# Patient Record
Sex: Female | Born: 1975 | Race: Black or African American | Hispanic: No | Marital: Single | Smoking: Current every day smoker
Health system: Southern US, Community
[De-identification: ages and names within clinical notes are randomized; demographics above are authoritative.]

## PROBLEM LIST (undated history)

## (undated) DIAGNOSIS — G4730 Sleep apnea, unspecified: Secondary | ICD-10-CM

## (undated) DIAGNOSIS — K219 Gastro-esophageal reflux disease without esophagitis: Secondary | ICD-10-CM

## (undated) DIAGNOSIS — I10 Essential (primary) hypertension: Secondary | ICD-10-CM

## (undated) DIAGNOSIS — G510 Bell's palsy: Secondary | ICD-10-CM

## (undated) DIAGNOSIS — E785 Hyperlipidemia, unspecified: Secondary | ICD-10-CM

## (undated) DIAGNOSIS — M549 Dorsalgia, unspecified: Secondary | ICD-10-CM

## (undated) HISTORY — PX: TOOTH EXTRACTION: SUR596

## (undated) HISTORY — DX: Gastro-esophageal reflux disease without esophagitis: K21.9

## (undated) HISTORY — PX: COLONOSCOPY: SHX174

## (undated) HISTORY — DX: Bell's palsy: G51.0

## (undated) HISTORY — PX: HERNIA REPAIR: SHX51

## (undated) HISTORY — PX: UTERINE FIBROID SURGERY: SHX826

## (undated) HISTORY — PX: GANGLION CYST EXCISION: SHX1691

## (undated) HISTORY — DX: Hyperlipidemia, unspecified: E78.5

## (undated) HISTORY — DX: Sleep apnea, unspecified: G47.30

---

## 1998-10-03 ENCOUNTER — Other Ambulatory Visit: Admission: RE | Admit: 1998-10-03 | Discharge: 1998-10-03 | Payer: Self-pay | Admitting: Obstetrics

## 2002-02-06 ENCOUNTER — Inpatient Hospital Stay (HOSPITAL_COMMUNITY): Admission: AD | Admit: 2002-02-06 | Discharge: 2002-02-06 | Payer: Self-pay | Admitting: Obstetrics

## 2002-02-07 ENCOUNTER — Encounter: Payer: Self-pay | Admitting: Obstetrics

## 2002-02-17 ENCOUNTER — Ambulatory Visit (HOSPITAL_COMMUNITY): Admission: RE | Admit: 2002-02-17 | Discharge: 2002-02-17 | Payer: Self-pay | Admitting: Obstetrics

## 2002-02-17 ENCOUNTER — Encounter (INDEPENDENT_AMBULATORY_CARE_PROVIDER_SITE_OTHER): Payer: Self-pay

## 2002-08-05 ENCOUNTER — Emergency Department (HOSPITAL_COMMUNITY): Admission: EM | Admit: 2002-08-05 | Discharge: 2002-08-05 | Payer: Self-pay | Admitting: Emergency Medicine

## 2002-08-05 ENCOUNTER — Encounter: Payer: Self-pay | Admitting: Emergency Medicine

## 2003-01-17 ENCOUNTER — Inpatient Hospital Stay (HOSPITAL_COMMUNITY): Admission: AD | Admit: 2003-01-17 | Discharge: 2003-01-17 | Payer: Self-pay | Admitting: Obstetrics

## 2003-06-02 ENCOUNTER — Inpatient Hospital Stay (HOSPITAL_COMMUNITY): Admission: AD | Admit: 2003-06-02 | Discharge: 2003-06-02 | Payer: Self-pay | Admitting: Obstetrics

## 2003-06-05 ENCOUNTER — Encounter (INDEPENDENT_AMBULATORY_CARE_PROVIDER_SITE_OTHER): Payer: Self-pay | Admitting: Specialist

## 2003-06-05 ENCOUNTER — Inpatient Hospital Stay (HOSPITAL_COMMUNITY): Admission: AD | Admit: 2003-06-05 | Discharge: 2003-06-08 | Payer: Self-pay | Admitting: Obstetrics

## 2008-02-18 ENCOUNTER — Ambulatory Visit (HOSPITAL_BASED_OUTPATIENT_CLINIC_OR_DEPARTMENT_OTHER): Admission: RE | Admit: 2008-02-18 | Discharge: 2008-02-18 | Payer: Self-pay | Admitting: General Surgery

## 2010-09-10 NOTE — Op Note (Signed)
Anna Walters, Anna Walters                   ACCOUNT NO.:  [id number]   MEDICAL RECORD NO.:  [id number]          PATIENT TYPE:  AMB   LOCATION:  [facility]                         FACILITY:  [facility]   PHYSICIAN:  Juanetta Gosling, MDDATE OF BIRTH:  [birth year]   DATE OF PROCEDURE:  DATE OF DISCHARGE:                               OPERATIVE REPORT   PREOPERATIVE DIAGNOSIS:  Umbilical hernia.   POSTOPERATIVE DIAGNOSIS:  A less than 1 cm umbilical hernia.   PROCEDURE PERFORMED:  Primary umbilical hernia repair with zero Ethibond  sutures.   SURGEON:  Troy Sine. Dwain Sarna, MD.   ASSISTANT:  None.   ANESTHESIA:  General.   SPECIMENS:  None.   DRAINS:  None.   COMPLICATIONS:  None.   ESTIMATED BLOOD LOSS:  Minimal.   DISPOSITION:  To PACU in stable condition.   INDICATIONS:  [name] is a 35 year old female with a several month  history of a symptomatic umbilical bulge.  She underwent a CT scan that  shows an 8 mm umbilical defect with incarcerated fat and on her exam she  is obese with a diastasis recti, as well as a small umbilical hernia.  Plan for repair of this umbilical hernia.  We did discuss at length that  her diastasis recti would not be repaired.   PROCEDURE:  After informed consent was obtained the patient was taken to  the operating room.  She was administered a gram of intravenous Ancef.  She had sequential compression devices placed.  She underwent general  anesthesia with an LMA.  Her abdomen was then prepped and draped in the  standard sterile surgical fashion.  A surgical time-out was then  performed.  I then made a curvilinear incision in the infraumbilical  region.  Dissection was carried out down to the level of the umbilical  stalk which was encircled with a Kelly clamp.  She is very obese and  this was difficult.  The umbilical stalk was then identified and then it  was removed from the umbilical hernia.  No entrance was made into the  umbilical skin during this  portion of the procedure.  It was everted and  inspected.  The defect was less than one cm just as shown on the CT scan  previously.  I reduced the defect and then after clearing off the edges  with electrocautery I was able to place four, zero Ethibond sutures and  to close the hernia primarily under no tension.  There was no hole upon  completion of this.  This was done with DeBakey in the hole ensuring  that the stitches were going just through the fascia.  Irrigation was  performed.  Hemostasis was observed.  She tolerated this well.  The  umbilicus was then tacked down with 3-0 undyed Vicryl.  The skin was  closed with 4-0  Monocryl.  She had a little bit of epidermal lysis at the skin edges  that I removed just because of the retraction that we were having to do  and through the incision due to her size.  The skin  was then closed with  4-0 Monocryl in subcuticular fashion.  She tolerated this well and was  transferred to the recovery room in stable condition.      Juanetta Gosling, MD  Electronically Signed     MCW/MEDQ  D:  02/18/2008  T:  02/18/2008  Job:  [id number]

## 2010-09-13 NOTE — Op Note (Signed)
   NAME:  Anna Walters, Anna Walters                             ACCOUNT NO.:  [id number]   MEDICAL RECORD NO.:  [id number]                   PATIENT TYPE:   LOCATION:                                       FACILITY:  [facility]   PHYSICIAN:  Kathreen Cosier, M.D.           DATE OF BIRTH:  Aug 27, 1975   DATE OF PROCEDURE:  02/17/2002  DATE OF DISCHARGE:                                 OPERATIVE REPORT   PREOPERATIVE DIAGNOSES:  Blatted ovum.   ANESTHESIA:  General.   PROCEDURE:  The patient placed in lithotomy position.  Perineum and vagina  prepped and draped.  Bladder emptied with straight catheter.  Bimanual  examination revealed uterus to be top normal size.  Endometrial cavity  sounded 9 cm.  Cervix was noted to be open and easily admitted a number 71  Pratt.  A number 10 suction used to aspirate uterine contents.  Small amount  of tissue obtained.  The patient tolerated procedure well.  Taken to  recovery room in good condition.                                               Kathreen Cosier, M.D.    BAM/MEDQ  D:  02/17/2002  T:  02/17/2002  Job:  [id number]

## 2011-01-28 LAB — CBC
HCT: 36.6
MCHC: 34.3
MCV: 106.3 — ABNORMAL HIGH
Platelets: 226
RDW: 13.3

## 2011-01-28 LAB — BASIC METABOLIC PANEL
BUN: 8
CO2: 26
Chloride: 108
Creatinine, Ser: 0.76
Glucose, Bld: 87
Potassium: 4

## 2011-01-28 LAB — DIFFERENTIAL
Basophils Absolute: 0
Basophils Relative: 0
Eosinophils Absolute: 0.1
Eosinophils Relative: 2
Lymphs Abs: 2
Neutrophils Relative %: 56

## 2011-01-28 LAB — PREGNANCY, URINE: Preg Test, Ur: NEGATIVE

## 2014-09-28 ENCOUNTER — Other Ambulatory Visit (HOSPITAL_COMMUNITY): Payer: Self-pay | Admitting: Obstetrics

## 2014-09-28 DIAGNOSIS — N858 Other specified noninflammatory disorders of uterus: Secondary | ICD-10-CM

## 2014-09-28 DIAGNOSIS — Z1231 Encounter for screening mammogram for malignant neoplasm of breast: Secondary | ICD-10-CM

## 2014-10-05 ENCOUNTER — Ambulatory Visit (HOSPITAL_COMMUNITY): Payer: Medicaid Other | Attending: Obstetrics

## 2014-10-05 ENCOUNTER — Ambulatory Visit (HOSPITAL_COMMUNITY): Payer: Medicaid Other

## 2014-12-15 ENCOUNTER — Emergency Department (HOSPITAL_COMMUNITY)
Admission: EM | Admit: 2014-12-15 | Discharge: 2014-12-15 | Disposition: A | Discharge status: Home / Self Care | Payer: No Typology Code available for payment source | Attending: Emergency Medicine | Admitting: Emergency Medicine

## 2014-12-15 ENCOUNTER — Encounter (HOSPITAL_COMMUNITY): Payer: Self-pay | Admitting: Nurse Practitioner

## 2014-12-15 DIAGNOSIS — Y998 Other external cause status: Secondary | ICD-10-CM | POA: Diagnosis not present

## 2014-12-15 DIAGNOSIS — Y9389 Activity, other specified: Secondary | ICD-10-CM | POA: Diagnosis not present

## 2014-12-15 DIAGNOSIS — I10 Essential (primary) hypertension: Secondary | ICD-10-CM | POA: Diagnosis not present

## 2014-12-15 DIAGNOSIS — S299XXA Unspecified injury of thorax, initial encounter: Secondary | ICD-10-CM | POA: Diagnosis present

## 2014-12-15 DIAGNOSIS — S5012XA Contusion of left forearm, initial encounter: Secondary | ICD-10-CM | POA: Insufficient documentation

## 2014-12-15 DIAGNOSIS — Y92410 Unspecified street and highway as the place of occurrence of the external cause: Secondary | ICD-10-CM | POA: Diagnosis not present

## 2014-12-15 DIAGNOSIS — S5011XA Contusion of right forearm, initial encounter: Secondary | ICD-10-CM | POA: Diagnosis not present

## 2014-12-15 DIAGNOSIS — Z720 Tobacco use: Secondary | ICD-10-CM | POA: Diagnosis not present

## 2014-12-15 HISTORY — DX: Essential (primary) hypertension: I10

## 2014-12-15 HISTORY — DX: Dorsalgia, unspecified: M54.9

## 2014-12-15 MED ORDER — METHOCARBAMOL 500 MG PO TABS: 500.0000 mg | ORAL_TABLET | Freq: Two times a day (BID) | ORAL | Status: DC

## 2014-12-15 MED ORDER — NAPROXEN 500 MG PO TABS: 500.0000 mg | ORAL_TABLET | Freq: Two times a day (BID) | ORAL | Status: DC
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
## 2017-10-20 ENCOUNTER — Ambulatory Visit: Payer: Self-pay | Attending: Nurse Practitioner | Admitting: Nurse Practitioner

## 2017-10-20 ENCOUNTER — Encounter: Payer: Self-pay | Admitting: Nurse Practitioner

## 2017-10-20 VITALS — BP 180/109 | HR 64 | Temp 98.3°F | Ht 64.0 in | Wt 178.0 lb

## 2017-10-20 DIAGNOSIS — M546 Pain in thoracic spine: Secondary | ICD-10-CM | POA: Insufficient documentation

## 2017-10-20 DIAGNOSIS — Z8249 Family history of ischemic heart disease and other diseases of the circulatory system: Secondary | ICD-10-CM | POA: Insufficient documentation

## 2017-10-20 DIAGNOSIS — G8929 Other chronic pain: Secondary | ICD-10-CM | POA: Insufficient documentation

## 2017-10-20 DIAGNOSIS — N921 Excessive and frequent menstruation with irregular cycle: Secondary | ICD-10-CM | POA: Insufficient documentation

## 2017-10-20 DIAGNOSIS — I10 Essential (primary) hypertension: Secondary | ICD-10-CM | POA: Insufficient documentation

## 2017-10-20 MED ORDER — AMLODIPINE BESYLATE 5 MG PO TABS: 5.0000 mg | ORAL_TABLET | Freq: Every day | ORAL | 1 refills | Status: DC

## 2017-10-20 MED ORDER — HYDROCHLOROTHIAZIDE 25 MG PO TABS: 25.0000 mg | ORAL_TABLET | Freq: Every day | ORAL | 3 refills | Status: DC

## 2017-10-20 MED ORDER — NAPROXEN 500 MG PO TABS: 500.0000 mg | ORAL_TABLET | Freq: Two times a day (BID) | ORAL | 1 refills | Status: DC

## 2017-10-20 MED ORDER — CLONIDINE HCL 0.1 MG PO TABS
0.2000 mg | ORAL_TABLET | Freq: Once | ORAL | Status: AC
  Administered 2017-10-20: 0.2 mg via ORAL

## 2017-10-20 MED ORDER — METHOCARBAMOL 500 MG PO TABS: 500.0000 mg | ORAL_TABLET | Freq: Two times a day (BID) | ORAL | 1 refills | Status: DC

## 2017-10-20 NOTE — Patient Instructions (Signed)
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
## 2017-10-21 LAB — CMP14+EGFR
A/G RATIO: 1.6 (ref 1.2–2.2)
ALT: 11 IU/L (ref 0–32)
AST: 19 IU/L (ref 0–40)
Albumin: 4.7 g/dL (ref 3.5–5.5)
Alkaline Phosphatase: 68 IU/L (ref 39–117)
BUN/Creatinine Ratio: 12 (ref 9–23)
BUN: 11 mg/dL (ref 6–24)
Bilirubin Total: 0.4 mg/dL (ref 0.0–1.2)
CALCIUM: 9.9 mg/dL (ref 8.7–10.2)
CO2: 24 mmol/L (ref 20–29)
Chloride: 105 mmol/L (ref 96–106)
Creatinine, Ser: 0.95 mg/dL (ref 0.57–1.00)
GFR, EST AFRICAN AMERICAN: 84 mL/min/{1.73_m2} (ref >59)
GFR, EST NON AFRICAN AMERICAN: 73 mL/min/{1.73_m2} (ref >59)
GLOBULIN, TOTAL: 3 g/dL (ref 1.5–4.5)
Glucose: 87 mg/dL (ref 65–99)
POTASSIUM: 4.8 mmol/L (ref 3.5–5.2)
SODIUM: 144 mmol/L (ref 134–144)
TOTAL PROTEIN: 7.7 g/dL (ref 6.0–8.5)

## 2017-10-21 LAB — CBC
Hematocrit: 38.7 % (ref 34.0–46.6)
Hemoglobin: 12.9 g/dL (ref 11.1–15.9)
MCH: 34.6 pg — ABNORMAL HIGH (ref 26.6–33.0)
MCHC: 33.3 g/dL (ref 31.5–35.7)
MCV: 104 fL — AB (ref 79–97)
Platelets: 296 10*3/uL (ref 150–450)
RBC: 3.73 x10E6/uL — AB (ref 3.77–5.28)
RDW: 12.9 % (ref 12.3–15.4)
WBC: 6.5 10*3/uL (ref 3.4–10.8)

## 2017-10-21 LAB — LIPID PANEL
CHOL/HDL RATIO: 2.7 ratio (ref 0.0–4.4)
Cholesterol, Total: 171 mg/dL (ref 100–199)
HDL: 64 mg/dL (ref >39)
LDL Calculated: 95 mg/dL (ref 0–99)
Triglycerides: 62 mg/dL (ref 0–149)
VLDL Cholesterol Cal: 12 mg/dL (ref 5–40)

## 2017-10-21 LAB — TSH: TSH: 0.787 u[IU]/mL (ref 0.450–4.500)

## 2017-11-20 ENCOUNTER — Ambulatory Visit: Payer: Self-pay | Admitting: Nurse Practitioner

## 2018-03-29 ENCOUNTER — Other Ambulatory Visit: Payer: Self-pay | Admitting: Nurse Practitioner

## 2018-03-29 DIAGNOSIS — M546 Pain in thoracic spine: Principal | ICD-10-CM

## 2018-03-29 DIAGNOSIS — G8929 Other chronic pain: Secondary | ICD-10-CM

## 2019-08-30 ENCOUNTER — Encounter (HOSPITAL_COMMUNITY): Payer: Self-pay | Admitting: Emergency Medicine

## 2019-08-30 ENCOUNTER — Emergency Department (HOSPITAL_COMMUNITY)
Admission: EM | Admit: 2019-08-30 | Discharge: 2019-08-30 | Discharge status: Against Medical Advice | Payer: No Typology Code available for payment source | Attending: Emergency Medicine | Admitting: Emergency Medicine

## 2019-08-30 ENCOUNTER — Other Ambulatory Visit: Payer: Self-pay

## 2019-08-30 ENCOUNTER — Other Ambulatory Visit (HOSPITAL_COMMUNITY)
Admission: RE | Admit: 2019-08-30 | Discharge: 2019-08-30 | Disposition: A | Discharge status: Home / Self Care | Payer: No Typology Code available for payment source | Source: Ambulatory Visit | Attending: Nurse Practitioner | Admitting: Nurse Practitioner

## 2019-08-30 ENCOUNTER — Ambulatory Visit (HOSPITAL_BASED_OUTPATIENT_CLINIC_OR_DEPARTMENT_OTHER): Payer: No Typology Code available for payment source | Admitting: Nurse Practitioner

## 2019-08-30 ENCOUNTER — Encounter: Payer: Self-pay | Admitting: Nurse Practitioner

## 2019-08-30 VITALS — BP 212/117 | HR 65 | Temp 97.7°F | Ht 64.0 in | Wt 176.0 lb

## 2019-08-30 DIAGNOSIS — E785 Hyperlipidemia, unspecified: Secondary | ICD-10-CM | POA: Diagnosis not present

## 2019-08-30 DIAGNOSIS — Z131 Encounter for screening for diabetes mellitus: Secondary | ICD-10-CM

## 2019-08-30 DIAGNOSIS — I10 Essential (primary) hypertension: Secondary | ICD-10-CM | POA: Diagnosis not present

## 2019-08-30 DIAGNOSIS — R42 Dizziness and giddiness: Secondary | ICD-10-CM | POA: Insufficient documentation

## 2019-08-30 DIAGNOSIS — Z1231 Encounter for screening mammogram for malignant neoplasm of breast: Secondary | ICD-10-CM

## 2019-08-30 DIAGNOSIS — N921 Excessive and frequent menstruation with irregular cycle: Secondary | ICD-10-CM

## 2019-08-30 DIAGNOSIS — Z114 Encounter for screening for human immunodeficiency virus [HIV]: Secondary | ICD-10-CM | POA: Diagnosis not present

## 2019-08-30 DIAGNOSIS — N898 Other specified noninflammatory disorders of vagina: Secondary | ICD-10-CM

## 2019-08-30 DIAGNOSIS — F17200 Nicotine dependence, unspecified, uncomplicated: Secondary | ICD-10-CM

## 2019-08-30 DIAGNOSIS — G510 Bell's palsy: Secondary | ICD-10-CM | POA: Diagnosis not present

## 2019-08-30 DIAGNOSIS — Z5321 Procedure and treatment not carried out due to patient leaving prior to being seen by health care provider: Secondary | ICD-10-CM | POA: Diagnosis not present

## 2019-08-30 LAB — BASIC METABOLIC PANEL
Anion gap: 11 (ref 5–15)
BUN: 11 mg/dL (ref 6–20)
CO2: 20 mmol/L — ABNORMAL LOW (ref 22–32)
Calcium: 9 mg/dL (ref 8.9–10.3)
Chloride: 106 mmol/L (ref 98–111)
Creatinine, Ser: 0.8 mg/dL (ref 0.44–1.00)
GFR calc Af Amer: >60 mL/min (ref >60)
GFR calc non Af Amer: >60 mL/min (ref >60)
Glucose, Bld: 85 mg/dL (ref 70–99)
Potassium: 4 mmol/L (ref 3.5–5.1)
Sodium: 137 mmol/L (ref 135–145)

## 2019-08-30 LAB — CBC
HCT: 36.7 % (ref 36.0–46.0)
Hemoglobin: 11.9 g/dL — ABNORMAL LOW (ref 12.0–15.0)
MCH: 35.3 pg — ABNORMAL HIGH (ref 26.0–34.0)
MCHC: 32.4 g/dL (ref 30.0–36.0)
MCV: 108.9 fL — ABNORMAL HIGH (ref 80.0–100.0)
Platelets: 271 10*3/uL (ref 150–400)
RBC: 3.37 MIL/uL — ABNORMAL LOW (ref 3.87–5.11)
RDW: 12.3 % (ref 11.5–15.5)
WBC: 6.7 10*3/uL (ref 4.0–10.5)
nRBC: 0 % (ref 0.0–0.2)

## 2019-08-30 LAB — URINALYSIS, ROUTINE W REFLEX MICROSCOPIC
Bacteria, UA: NONE SEEN
Bilirubin Urine: NEGATIVE
Glucose, UA: NEGATIVE mg/dL
Ketones, ur: 5 mg/dL — AB
Leukocytes,Ua: NEGATIVE
Nitrite: NEGATIVE
Protein, ur: NEGATIVE mg/dL
Specific Gravity, Urine: 1.015 (ref 1.005–1.030)
pH: 7 (ref 5.0–8.0)

## 2019-08-30 MED ORDER — SODIUM CHLORIDE 0.9% FLUSH: 3.0000 mL | Freq: Once | INTRAVENOUS | Status: DC

## 2019-08-30 MED ORDER — PREDNISONE 20 MG PO TABS: 60.0000 mg | ORAL_TABLET | Freq: Every day | ORAL | 0 refills | Status: AC

## 2019-08-30 MED ORDER — VALACYCLOVIR HCL 1 G PO TABS: 1000.0000 mg | ORAL_TABLET | Freq: Three times a day (TID) | ORAL | 0 refills | Status: AC

## 2019-08-30 MED ORDER — OMEPRAZOLE 20 MG PO CPDR: 20.0000 mg | DELAYED_RELEASE_CAPSULE | Freq: Every day | ORAL | 0 refills | Status: DC

## 2019-08-30 MED ORDER — CLONIDINE HCL 0.1 MG PO TABS
0.2000 mg | ORAL_TABLET | Freq: Once | ORAL | Status: AC
  Administered 2019-08-30: 0.2 mg via ORAL

## 2019-08-30 NOTE — ED Notes (Signed)
Anna Walters and [name] called for pt x4 no response.

## 2019-08-30 NOTE — ED Triage Notes (Signed)
Pt reports htn for the past year and has been unable to get it under control, not prescribed medications for it. [facility] and sent here for BP of 212/117. Pt has been dizzy since yesterday and states it has gotten worse. Pt reports she was having headaches earlier. Denies pain at this time.
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
## 2019-08-31 LAB — CMP14+EGFR
ALT: 15 IU/L (ref 0–32)
AST: 23 IU/L (ref 0–40)
Albumin/Globulin Ratio: 2 (ref 1.2–2.2)
Albumin: 4.7 g/dL (ref 3.8–4.8)
Alkaline Phosphatase: 72 IU/L (ref 39–117)
BUN/Creatinine Ratio: 17 (ref 9–23)
BUN: 13 mg/dL (ref 6–24)
Bilirubin Total: 0.4 mg/dL (ref 0.0–1.2)
CO2: 23 mmol/L (ref 20–29)
Calcium: 9.6 mg/dL (ref 8.7–10.2)
Chloride: 105 mmol/L (ref 96–106)
Creatinine, Ser: 0.76 mg/dL (ref 0.57–1.00)
GFR calc Af Amer: 109 mL/min/{1.73_m2} (ref >59)
GFR calc non Af Amer: 94 mL/min/{1.73_m2} (ref >59)
Globulin, Total: 2.4 g/dL (ref 1.5–4.5)
Glucose: 78 mg/dL (ref 65–99)
Potassium: 4.6 mmol/L (ref 3.5–5.2)
Sodium: 140 mmol/L (ref 134–144)
Total Protein: 7.1 g/dL (ref 6.0–8.5)

## 2019-08-31 LAB — CBC
Hematocrit: 38.4 % (ref 34.0–46.6)
Hemoglobin: 12.7 g/dL (ref 11.1–15.9)
MCH: 35.1 pg — ABNORMAL HIGH (ref 26.6–33.0)
MCHC: 33.1 g/dL (ref 31.5–35.7)
MCV: 106 fL — ABNORMAL HIGH (ref 79–97)
Platelets: 291 10*3/uL (ref 150–450)
RBC: 3.62 x10E6/uL — ABNORMAL LOW (ref 3.77–5.28)
RDW: 11.7 % (ref 11.7–15.4)
WBC: 7.5 10*3/uL (ref 3.4–10.8)

## 2019-08-31 LAB — LIPID PANEL
Chol/HDL Ratio: 2.9 ratio (ref 0.0–4.4)
Cholesterol, Total: 193 mg/dL (ref 100–199)
HDL: 66 mg/dL (ref >39)
LDL Chol Calc (NIH): 114 mg/dL — ABNORMAL HIGH (ref 0–99)
Triglycerides: 68 mg/dL (ref 0–149)
VLDL Cholesterol Cal: 13 mg/dL (ref 5–40)

## 2019-08-31 LAB — HEMOGLOBIN A1C
Est. average glucose Bld gHb Est-mCnc: 82 mg/dL
Hgb A1c MFr Bld: 4.5 % — ABNORMAL LOW (ref 4.8–5.6)

## 2019-08-31 LAB — HIV ANTIBODY (ROUTINE TESTING W REFLEX): HIV Screen 4th Generation wRfx: NONREACTIVE

## 2019-09-01 ENCOUNTER — Other Ambulatory Visit: Payer: Self-pay | Admitting: Nurse Practitioner

## 2019-09-01 LAB — CERVICOVAGINAL ANCILLARY ONLY
Bacterial Vaginitis (gardnerella): POSITIVE — AB
Candida Glabrata: NEGATIVE
Candida Vaginitis: NEGATIVE
Chlamydia: NEGATIVE
Comment: NEGATIVE
Comment: NEGATIVE
Comment: NEGATIVE
Comment: NEGATIVE
Comment: NEGATIVE
Comment: NORMAL
Neisseria Gonorrhea: NEGATIVE
Trichomonas: NEGATIVE

## 2019-09-01 MED ORDER — ATORVASTATIN CALCIUM 20 MG PO TABS
20.0000 mg | ORAL_TABLET | Freq: Every day | ORAL | 3 refills | Status: DC
Start: 2019-09-01 — End: 2020-09-06

## 2019-09-06 ENCOUNTER — Other Ambulatory Visit: Payer: Self-pay | Admitting: Nurse Practitioner

## 2019-09-06 DIAGNOSIS — G510 Bell's palsy: Secondary | ICD-10-CM

## 2019-09-07 ENCOUNTER — Ambulatory Visit: Payer: No Typology Code available for payment source | Attending: Nurse Practitioner | Admitting: Nurse Practitioner

## 2019-09-07 ENCOUNTER — Other Ambulatory Visit: Payer: Self-pay

## 2019-09-07 ENCOUNTER — Encounter: Payer: Self-pay | Admitting: Nurse Practitioner

## 2019-09-07 DIAGNOSIS — I10 Essential (primary) hypertension: Secondary | ICD-10-CM | POA: Diagnosis not present

## 2019-09-07 DIAGNOSIS — G510 Bell's palsy: Secondary | ICD-10-CM

## 2019-09-07 MED ORDER — HYDROCHLOROTHIAZIDE 25 MG PO TABS: 25.0000 mg | ORAL_TABLET | Freq: Every day | ORAL | 3 refills | Status: DC

## 2019-09-07 MED ORDER — METRONIDAZOLE 500 MG PO TABS: 500.0000 mg | ORAL_TABLET | Freq: Two times a day (BID) | ORAL | 0 refills | Status: AC

## 2019-09-07 MED ORDER — AMLODIPINE BESYLATE 10 MG PO TABS: 10.0000 mg | ORAL_TABLET | Freq: Every day | ORAL | 1 refills | Status: DC

## 2019-09-07 MED ORDER — BLOOD PRESSURE MONITOR DEVI: 0 refills | Status: DC

## 2019-09-07 NOTE — Progress Notes (Signed)
Virtual Visit via Telephone Note Due to national recommendations of social distancing due to Anna Walters, telehealth visit is felt to be most appropriate for this patient at this time.  I discussed the limitations, risks, security and privacy concerns of performing an evaluation and management service by telephone and the availability of in person appointments. I also discussed with the patient that there may be a patient responsible charge related to this service. The patient expressed understanding and agreed to proceed.    I connected with Anna Walters on 09/07/19  at   8:30 AM EDT  EDT by telephone and verified that I am speaking with the correct person using two identifiers.   Consent I discussed the limitations, risks, security and privacy concerns of performing an evaluation and management service by telephone and the availability of in person appointments. I also discussed with the patient that there may be a patient responsible charge related to this service. The patient expressed understanding and agreed to proceed.   Location of Patient: Private Residence   Location of Provider: [place] and [employer] Office    Persons participating in Telemedicine visit: [name] FNP-BC [place]    History of Present Illness: Telemedicine visit for: HTN  She was sent to the Emergency room for hypertensive urgency after an office visit with me on 08-30-2019 (she had been out of blood pressure medications for over a year). At that time she was also experiencing symptoms of re occuring Bell's Palsy. Unfortunately she went to the ED but did not stay for treatment.   Essential Hypertension She has not been able to check her blood pressure as she does not have a device. I have sent one through her insurance today. She states she will have her blood pressure checked at her job today. She will keep me posted through [place] regarding her readings. I have sent in her HCTZ 25  mg and increased her amlodipine 5mg  to 10 mg today. Denies chest pain, shortness of breath, palpitations, lightheadedness, dizziness, headaches or BLE edema.  BP Readings from Last 3 Encounters:  08/30/19 (!) 190/107  08/30/19 (!) 212/117  06/25/Walters (!) 180/109    Bell's Palsy I prescribed her prednisone 20 mg and Valtrex 1000 mg at her last office visit. She did at that time state she had a history of Bell's Palsy in the past but had never seen a Neurologist. I referred her to Neurology and they attempted to contact her however she was not aware they had been trying to contact her. She will give them a call to schedule. Current symptoms of facial droop and right eye tic have not improved much. She has completed all medications.     Past Medical History:  Diagnosis Date  . Back pain   . Bell's palsy   . Hypertension     Past Surgical History:  Procedure Laterality Date  . GANGLION CYST EXCISION    . HERNIA REPAIR    . UTERINE FIBROID SURGERY      Family History  Problem Relation Age of Onset  . Hypertension Mother   . Hypertension Father   . Cancer Maternal Grandmother   . Cancer Paternal Grandmother     Social History   Socioeconomic History  . Marital status: Single    Spouse name: Not on file  . Number of children: Not on file  . Years of education: Not on file  . Highest education level: Not on file  Occupational  History  . Not on file  Tobacco Use  . Smoking status: Current Every Day Smoker    Packs/day: 0.50    Types: Cigarettes  . Smokeless tobacco: Never Used  . Tobacco comment: 8 cigarettes per day  Substance and Sexual Activity  . Alcohol use: Yes    Comment: occasionally   . Drug use: Yes    Types: Marijuana  . Sexual activity: Yes    Birth control/protection: None  Other Topics Concern  . Not on file  Social History Narrative  . Not on file   Social Determinants of Health   Financial Resource Strain:   . Difficulty of Paying Living Expenses:    Food Insecurity:   . Worried About Charity fundraiser in the Last Year:   . Arboriculturist in the Last Year:   Transportation Needs:   . Film/video editor (Medical):   Marland Kitchen Lack of Transportation (Non-Medical):   Physical Activity:   . Days of Exercise per Week:   . Minutes of Exercise per Session:   Stress:   . Feeling of Stress :   Social Connections:   . Frequency of Communication with Friends and Family:   . Frequency of Social Gatherings with Friends and Family:   . Attends Religious Services:   . Active Member of Clubs or Organizations:   . Attends Archivist Meetings:   Marland Kitchen Marital Status:      Observations/Objective: Awake, alert and oriented x 3   Review of Systems  Constitutional: Negative for fever, malaise/fatigue and weight loss.  HENT: Negative.  Negative for nosebleeds.   Eyes: Negative.  Negative for blurred vision, double vision and photophobia.  Respiratory: Negative.  Negative for cough and shortness of breath.   Cardiovascular: Negative.  Negative for chest pain, palpitations and leg swelling.  Gastrointestinal: Negative.  Negative for heartburn, nausea and vomiting.  Musculoskeletal: Negative.  Negative for myalgias.  Neurological: Positive for tingling and sensory change. Negative for dizziness, speech change, focal weakness, seizures and headaches.  Psychiatric/Behavioral: Negative.  Negative for suicidal ideas.    Assessment and Plan: [name] was seen today for follow-up.  Diagnoses and all orders for this visit:  Essential hypertension -     amLODipine (NORVASC) 10 MG tablet; Take 1 tablet (10 mg total) by mouth daily. -     Blood Pressure Monitor DEVI; Please provide patient with insurance approved blood pressure monitor. I10.0 -     hydrochlorothiazide (HYDRODIURIL) 25 MG tablet; Take 1 tablet (25 mg total) by mouth daily.  Facial paralysis/Bells palsy Instructions given to contact [facility] for scheduling.     Follow Up  Instructions Return in about 2 weeks (around 09/21/2019) for BP recheck.     I discussed the assessment and treatment plan with the patient. The patient was provided an opportunity to ask questions and all were answered. The patient agreed with the plan and demonstrated an understanding of the instructions.   The patient was advised to call back or seek an in-person evaluation if the symptoms worsen or if the condition fails to improve as anticipated.  I provided 14 minutes of non-face-to-face time during this encounter including median intraservice time, reviewing previous notes, labs, imaging, medications and explaining diagnosis and management.  Gildardo Pounds, FNP-BC

## 2019-09-23 ENCOUNTER — Ambulatory Visit: Payer: No Typology Code available for payment source | Admitting: Nurse Practitioner

## 2019-10-19 ENCOUNTER — Other Ambulatory Visit: Payer: Self-pay

## 2019-10-19 ENCOUNTER — Ambulatory Visit
Admission: RE | Admit: 2019-10-19 | Discharge: 2019-10-19 | Disposition: A | Discharge status: Home / Self Care | Payer: No Typology Code available for payment source | Source: Ambulatory Visit | Attending: Nurse Practitioner | Admitting: Nurse Practitioner

## 2019-10-19 DIAGNOSIS — Z1231 Encounter for screening mammogram for malignant neoplasm of breast: Secondary | ICD-10-CM

## 2019-10-20 ENCOUNTER — Ambulatory Visit (INDEPENDENT_AMBULATORY_CARE_PROVIDER_SITE_OTHER): Payer: No Typology Code available for payment source | Admitting: Neurology

## 2019-10-20 ENCOUNTER — Encounter: Payer: Self-pay | Admitting: Neurology

## 2019-10-20 ENCOUNTER — Telehealth: Payer: Self-pay | Admitting: Neurology

## 2019-10-20 ENCOUNTER — Other Ambulatory Visit: Payer: Self-pay

## 2019-10-20 VITALS — BP 190/98 | HR 69 | Ht 63.0 in | Wt 180.5 lb

## 2019-10-20 DIAGNOSIS — R519 Headache, unspecified: Secondary | ICD-10-CM

## 2019-10-20 DIAGNOSIS — R29810 Facial weakness: Secondary | ICD-10-CM | POA: Insufficient documentation

## 2019-10-20 MED ORDER — GABAPENTIN 300 MG PO CAPS
300.0000 mg | ORAL_CAPSULE | Freq: Three times a day (TID) | ORAL | 11 refills | Status: DC
Start: 2019-10-20 — End: 2020-09-06

## 2019-10-20 NOTE — Telephone Encounter (Signed)
Medicaid order sent to [facility]. They will obtain the auth and reach out to the patient to schedule.  

## 2019-10-20 NOTE — Progress Notes (Signed)
HISTORICAL  Anna Walters is a 44 year old female, seen in request by her primary care nurse practitioner [name] for evaluation of right facial twitching, and right facial pain, initial evaluation was on October 20, 2019.  I reviewed and summarized the referring note. She had a past medical history of hypertension, taking Norvasc 10 mg daily, hydrochlorothiazide 25 mg daily Hyperlipidemia, Lipitor 20 mg daily  She suffered right Bell's palsy in 2018, presented with right upper and lower face weakness, she only gained partial recovery, still has mild right facial asymmetry, since beginning of 2021, she noticed intermittent right facial muscle twitching, mainly surrounding her eyes, occasionally drop her right cheek muscle  She presented to emergency room on Aug 30, 2019, because of right facial muscle twitching, in addition, she complains of 6 months history of right cheek area radiating pain, started since early 2021, gradually getting worse over the past few months, now she has more frequent radiating pain from right nose towards right ear, sharp radiating pain, lasting for few minutes, followed by a trail of achy discomfort afterwards    Laboratory evaluation showed CBC Hg 11.9,  MCV 108.9, MCH 35.3, BMP, creat 0.8, negative HIV,  LDL 114, A1C 4.5  REVIEW OF SYSTEMS: Full 14 system review of systems performed and notable only for as above All other review of systems were negative.  ALLERGIES: No Known Allergies  HOME MEDICATIONS: Current Outpatient Medications  Medication Sig Dispense Refill  . Acetaminophen (TYLENOL PO) Take by mouth.    Marland Kitchen amLODipine (NORVASC) 10 MG tablet Take 1 tablet (10 mg total) by mouth daily. 90 tablet 1  . atorvastatin (LIPITOR) 20 MG tablet Take 1 tablet (20 mg total) by mouth daily. 90 tablet 3  . Blood Pressure Monitor DEVI Please provide patient with insurance approved blood pressure monitor. I10.0 1 each 0  . hydrochlorothiazide (HYDRODIURIL) 25 MG  tablet Take 1 tablet (25 mg total) by mouth daily. 90 tablet 3  . omeprazole (PRILOSEC) 20 MG capsule Take 1 capsule (20 mg total) by mouth daily for 10 days. 10 capsule 0   No current facility-administered medications for this visit.    PAST MEDICAL HISTORY: Past Medical History:  Diagnosis Date  . Back pain   . Bell's palsy   . Hypertension     PAST SURGICAL HISTORY: Past Surgical History:  Procedure Laterality Date  . GANGLION CYST EXCISION    . HERNIA REPAIR    . TOOTH EXTRACTION    . UTERINE FIBROID SURGERY      FAMILY HISTORY: Family History  Problem Relation Age of Onset  . Hypertension Mother   . Hypertension Father   . Cancer Maternal Grandmother   . Cancer Paternal Grandmother     SOCIAL HISTORY: Social History   Socioeconomic History  . Marital status: Single    Spouse name: Not on file  . Number of children: 3  . Years of education: college  . Highest education level: Associate degree: occupational, Hotel manager, or vocational program  Occupational History  . Occupation: Art therapist  Tobacco Use  . Smoking status: Current Every Day Smoker    Packs/day: 0.25    Types: Cigarettes  . Smokeless tobacco: Never Used  Vaping Use  . Vaping Use: Never used  Substance and Sexual Activity  . Alcohol use: Yes    Comment: socially  . Drug use: Yes    Types: Marijuana    Comment: socially  . Sexual activity: Yes    Birth control/protection:  None  Other Topics Concern  . Not on file  Social History Narrative   Lives at home with her youngest son.   Right-handed.   Caffeine use: 1 cup coffee per day.   Social Determinants of Health   Financial Resource Strain:   . Difficulty of Paying Living Expenses:   Food Insecurity:   . Worried About Charity fundraiser in the Last Year:   . Arboriculturist in the Last Year:   Transportation Needs:   . Film/video editor (Medical):   Marland Kitchen Lack of Transportation (Non-Medical):   Physical Activity:   .  Days of Exercise per Week:   . Minutes of Exercise per Session:   Stress:   . Feeling of Stress :   Social Connections:   . Frequency of Communication with Friends and Family:   . Frequency of Social Gatherings with Friends and Family:   . Attends Religious Services:   . Active Member of Clubs or Organizations:   . Attends Archivist Meetings:   Marland Kitchen Marital Status:   Intimate Partner Violence:   . Fear of Current or Ex-Partner:   . Emotionally Abused:   Marland Kitchen Physically Abused:   . Sexually Abused:      PHYSICAL EXAM   Vitals:   10/20/19 1048  BP: (!) 190/98  Pulse: 69  Weight: 180 lb 8 oz (81.9 kg)  Height: 5\' 3"  (1.6 m)   Not recorded     Body mass index is 31.97 kg/m.  PHYSICAL EXAMNIATION:  Gen: NAD, conversant, well nourised, well groomed                     Cardiovascular: Regular rate rhythm, no peripheral edema, warm, nontender. Eyes: Conjunctivae clear without exudates or hemorrhage Neck: Supple, no carotid bruits. Pulmonary: Clear to auscultation bilaterally   NEUROLOGICAL EXAM:  MENTAL STATUS: Speech:    Speech is normal; fluent and spontaneous with normal comprehension.  Cognition:     Orientation to time, place and person     Normal recent and remote memory     Normal Attention span and concentration     Normal Language, naming, repeating,spontaneous speech     Fund of knowledge   CRANIAL NERVES: CN II: Visual fields are full to confrontation. Pupils are round equal and briskly reactive to light. CN III, IV, VI: extraocular movement are normal. No ptosis. CN V: Facial sensation is intact to light touch bilateral corneal reflexes were normal CN VII: She has mild facial asymmetry, moderate right frontalis, orbicularis oculi weakness, also moderate right cheek muscle weakness, occasionally synergistic movement of right facial muscles twitching of the muscles surrounding her right eye, when she puff her cheeks, she has uncontrollable right  orbicularis oculi muscle twitching  CN VIII: Hearing is normal to causal conversation. CN IX, X: Phonation is normal. CN XI: Head turning and shoulder shrug are intact  MOTOR: There is no pronator drift of out-stretched arms. Muscle bulk and tone are normal. Muscle strength is normal.  REFLEXES: Reflexes are 2+ and symmetric at the biceps, triceps, knees, and ankles. Plantar responses are flexor.  SENSORY: Intact to light touch, pinprick and vibratory sensation are intact in fingers and toes.  COORDINATION: There is no trunk or limb dysmetria noted.  GAIT/STANCE: Posture is normal. Gait is steady with normal steps, base, arm swing, and turning. Heel and toe walking are normal. Tandem gait is normal.  Romberg is absent.   DIAGNOSTIC DATA (LABS,  IMAGING, TESTING) - I reviewed patient records, labs, notes, testing and imaging myself where available.   ASSESSMENT AND PLAN  MICHALINA CALBERT is a 44 y.o. female   History of right Bell's palsy with partial recovery  There was evidence of residual right upper and lower face weakness New onset right facial pain involving right V2 distribution since January 2021  MRI of the brain with without contrast to rule out brain base pathology  Laboratory evaluations  Gabapentin 300 mg 3 times daily  Marcial Pacas, M.D. Ph.D.  [facility] [address] [place], [place] [address] Ph: [phone] Fax: [phone]  CC:  Gildardo Pounds, NP [address] [place],  [place] [address]   Primary Care Physician Gildardo Pounds, NP

## 2019-10-24 ENCOUNTER — Telehealth: Payer: Self-pay | Admitting: Neurology

## 2019-10-24 DIAGNOSIS — E559 Vitamin D deficiency, unspecified: Secondary | ICD-10-CM

## 2019-10-24 MED ORDER — VITAMIN D3 1.25 MG (50000 UT) PO CAPS: 1.0000 | ORAL_CAPSULE | ORAL | 0 refills | Status: AC

## 2019-10-24 NOTE — Telephone Encounter (Signed)
Patient returned your call. She is at work so she said it was okay to leave a message.

## 2019-10-24 NOTE — Telephone Encounter (Signed)
Left message requesting a call back.

## 2019-10-24 NOTE — Telephone Encounter (Signed)
Left patient a detailed message, with results and supplement instructions, on voicemail (ok per DPR).  Provided our number to call back with any questions.

## 2019-10-24 NOTE — Telephone Encounter (Signed)
Please call patient laboratory evaluation showed significantly vitamin D deficiency, level was only 7, with normal should be 30 NG/mL and above, I have called in vitamin D3 supplements 50,000 units once weekly for 12 weeks, then she should followed by over-the-counter vitamin D3 supplements 1000 units daily  Rest of the laboratory evaluation showed no no significant abnormality, with Lyme titer still pending  Meds ordered this encounter  Medications   Cholecalciferol (VITAMIN D3) 1.25 MG (50000 UT) CAPS    Sig: Take 1 capsule by mouth once a week.    Dispense:  12 capsule    Refill:  0    I have also forward the lab results to her primary care Gildardo Pounds, NP

## 2019-10-27 DIAGNOSIS — Z419 Encounter for procedure for purposes other than remedying health state, unspecified: Secondary | ICD-10-CM | POA: Diagnosis not present

## 2019-10-27 LAB — LYME, WESTERN BLOT, SERUM (REFLEXED)
IgG P18 Ab.: ABSENT
IgG P23 Ab.: ABSENT
IgG P28 Ab.: ABSENT
IgG P30 Ab.: ABSENT
IgG P39 Ab.: ABSENT
IgG P45 Ab.: ABSENT
IgG P58 Ab.: ABSENT
IgG P66 Ab.: ABSENT
IgG P93 Ab.: ABSENT
IgM P23 Ab.: ABSENT
IgM P39 Ab.: ABSENT
Lyme IgG Wb: NEGATIVE
Lyme IgM Wb: NEGATIVE

## 2019-10-27 LAB — SEDIMENTATION RATE: Sed Rate: 4 mm/hr (ref 0–32)

## 2019-10-27 LAB — ANGIOTENSIN CONVERTING ENZYME: Angio Convert Enzyme: 56 U/L (ref 14–82)

## 2019-10-27 LAB — IRON AND TIBC
Iron Saturation: 36 % (ref 15–55)
Iron: 110 ug/dL (ref 27–159)
Total Iron Binding Capacity: 306 ug/dL (ref 250–450)
UIBC: 196 ug/dL (ref 131–425)

## 2019-10-27 LAB — B. BURGDORFI ANTIBODIES: Lyme IgG/IgM Ab: 1.44 {ISR} — ABNORMAL HIGH (ref 0.00–0.90)

## 2019-10-27 LAB — ANA W/REFLEX IF POSITIVE: Anti Nuclear Antibody (ANA): NEGATIVE

## 2019-10-27 LAB — VITAMIN B12: Vitamin B-12: 470 pg/mL (ref 232–1245)

## 2019-10-27 LAB — COPPER, SERUM: Copper: 122 ug/dL (ref 80–158)

## 2019-10-27 LAB — CK: Total CK: 163 U/L (ref 32–182)

## 2019-10-27 LAB — FERRITIN: Ferritin: 122 ng/mL (ref 15–150)

## 2019-10-27 LAB — C-REACTIVE PROTEIN: CRP: <1 mg/L (ref 0–10)

## 2019-10-27 LAB — RPR: RPR Ser Ql: NONREACTIVE

## 2019-10-27 LAB — VITAMIN D 25 HYDROXY (VIT D DEFICIENCY, FRACTURES): Vit D, 25-Hydroxy: 7 ng/mL — ABNORMAL LOW (ref 30.0–100.0)

## 2019-11-27 DIAGNOSIS — Z419 Encounter for procedure for purposes other than remedying health state, unspecified: Secondary | ICD-10-CM | POA: Diagnosis not present

## 2019-12-28 DIAGNOSIS — Z419 Encounter for procedure for purposes other than remedying health state, unspecified: Secondary | ICD-10-CM | POA: Diagnosis not present

## 2019-12-29 ENCOUNTER — Encounter: Payer: Self-pay | Admitting: Neurology

## 2019-12-29 ENCOUNTER — Ambulatory Visit: Payer: No Typology Code available for payment source | Admitting: Neurology

## 2019-12-29 NOTE — Progress Notes (Deleted)
PATIENT: Anna Walters DOB: 11/14/1975  REASON FOR VISIT: follow up HISTORY FROM: patient  HISTORY OF PRESENT ILLNESS: Today 12/29/19  HISTORY  Anna Walters is a 44 year old female, seen in request by her primary care nurse practitioner [name] for evaluation of right facial twitching, and right facial pain, initial evaluation was on October 20, 2019.  I reviewed and summarized the referring note. She had a past medical history of hypertension, taking Norvasc 10 mg daily, hydrochlorothiazide 25 mg daily Hyperlipidemia, Lipitor 20 mg daily  She suffered right Bell's palsy in 2018, presented with right upper and lower face weakness, she only gained partial recovery, still has mild right facial asymmetry, since beginning of 2021, she noticed intermittent right facial muscle twitching, mainly surrounding her eyes, occasionally drop her right cheek muscle  She presented to emergency room on Aug 30, 2019, because of right facial muscle twitching, in addition, she complains of 6 months history of right cheek area radiating pain, started since early 2021, gradually getting worse over the past few months, now she has more frequent radiating pain from right nose towards right ear, sharp radiating pain, lasting for few minutes, followed by a trail of achy discomfort afterwards    Laboratory evaluation showed CBC Hg 11.9,  MCV 108.9, MCH 35.3, BMP, creat 0.8, negative HIV,  LDL 114, A1C 4.5  Update December 29, 2019 SS: Laboratory evaluation revealed low vitamin D 7, started on vitamin D supplement 5000 units weekly for 12 weeks, then OTC 1000 units daily.   Extensive laboratory evaluation, RPR, B12, CRP, CK, sed rate, ANA, copper, ferritin, iron, angiotensin converting enzyme were unremarkable.  LYME  REVIEW OF SYSTEMS: Out of a complete 14 system review of symptoms, the patient complains only of the following symptoms, and all other reviewed systems are negative.  ALLERGIES: No Known  Allergies  HOME MEDICATIONS: Outpatient Medications Prior to Visit  Medication Sig Dispense Refill  . Acetaminophen (TYLENOL PO) Take by mouth.    Marland Kitchen amLODipine (NORVASC) 10 MG tablet Take 1 tablet (10 mg total) by mouth daily. 90 tablet 1  . atorvastatin (LIPITOR) 20 MG tablet Take 1 tablet (20 mg total) by mouth daily. 90 tablet 3  . Blood Pressure Monitor DEVI Please provide patient with insurance approved blood pressure monitor. I10.0 1 each 0  . Cholecalciferol (VITAMIN D3) 1.25 MG (50000 UT) CAPS Take 1 capsule by mouth once a week. 12 capsule 0  . gabapentin (NEURONTIN) 300 MG capsule Take 1 capsule (300 mg total) by mouth 3 (three) times daily. 90 capsule 11  . hydrochlorothiazide (HYDRODIURIL) 25 MG tablet Take 1 tablet (25 mg total) by mouth daily. 90 tablet 3  . omeprazole (PRILOSEC) 20 MG capsule Take 1 capsule (20 mg total) by mouth daily for 10 days. 10 capsule 0   No facility-administered medications prior to visit.    PAST MEDICAL HISTORY: Past Medical History:  Diagnosis Date  . Back pain   . Bell's palsy   . Hypertension     PAST SURGICAL HISTORY: Past Surgical History:  Procedure Laterality Date  . GANGLION CYST EXCISION    . HERNIA REPAIR    . TOOTH EXTRACTION    . UTERINE FIBROID SURGERY      FAMILY HISTORY: Family History  Problem Relation Age of Onset  . Hypertension Mother   . Hypertension Father   . Cancer Maternal Grandmother   . Cancer Paternal Grandmother     SOCIAL HISTORY: Social History  Socioeconomic History  . Marital status: Single    Spouse name: Not on file  . Number of children: 3  . Years of education: college  . Highest education level: Associate degree: occupational, Hotel manager, or vocational program  Occupational History  . Occupation: Art therapist  Tobacco Use  . Smoking status: Current Every Day Smoker    Packs/day: 0.25    Types: Cigarettes  . Smokeless tobacco: Never Used  Vaping Use  . Vaping Use: Never used   Substance and Sexual Activity  . Alcohol use: Yes    Comment: socially  . Drug use: Yes    Types: Marijuana    Comment: socially  . Sexual activity: Yes    Birth control/protection: None  Other Topics Concern  . Not on file  Social History Narrative   Lives at home with her youngest son.   Right-handed.   Caffeine use: 1 cup coffee per day.   Social Determinants of Health   Financial Resource Strain:   . Difficulty of Paying Living Expenses: Not on file  Food Insecurity:   . Worried About Charity fundraiser in the Last Year: Not on file  . Ran Out of Food in the Last Year: Not on file  Transportation Needs:   . Lack of Transportation (Medical): Not on file  . Lack of Transportation (Non-Medical): Not on file  Physical Activity:   . Days of Exercise per Week: Not on file  . Minutes of Exercise per Session: Not on file  Stress:   . Feeling of Stress : Not on file  Social Connections:   . Frequency of Communication with Friends and Family: Not on file  . Frequency of Social Gatherings with Friends and Family: Not on file  . Attends Religious Services: Not on file  . Active Member of Clubs or Organizations: Not on file  . Attends Archivist Meetings: Not on file  . Marital Status: Not on file  Intimate Partner Violence:   . Fear of Current or Ex-Partner: Not on file  . Emotionally Abused: Not on file  . Physically Abused: Not on file  . Sexually Abused: Not on file      PHYSICAL EXAM  There were no vitals filed for this visit. There is no height or weight on file to calculate BMI.  Generalized: Well developed, in no acute distress   Neurological examination  Mentation: Alert oriented to time, place, history taking. Follows all commands speech and language fluent Cranial nerve II-XII: Pupils were equal round reactive to light. Extraocular movements were full, visual field were full on confrontational test. Facial sensation and strength were normal. Uvula  tongue midline. Head turning and shoulder shrug  were normal and symmetric. Motor: The motor testing reveals 5 over 5 strength of all 4 extremities. Good symmetric motor tone is noted throughout.  Sensory: Sensory testing is intact to soft touch on all 4 extremities. No evidence of extinction is noted.  Coordination: Cerebellar testing reveals good finger-nose-finger and heel-to-shin bilaterally.  Gait and station: Gait is normal. Tandem gait is normal. Romberg is negative. No drift is seen.  Reflexes: Deep tendon reflexes are symmetric and normal bilaterally.   DIAGNOSTIC DATA (LABS, IMAGING, TESTING) - I reviewed patient records, labs, notes, testing and imaging myself where available.  Lab Results  Component Value Date   WBC 6.7 08/30/2019   HGB 11.9 (L) 08/30/2019   HCT 36.7 08/30/2019   MCV 108.9 (H) 08/30/2019   PLT 271 08/30/2019  Component Value Date/Time   NA 137 08/30/2019 1554   NA 140 08/30/2019 1500   K 4.0 08/30/2019 1554   CL 106 08/30/2019 1554   CO2 20 (L) 08/30/2019 1554   GLUCOSE 85 08/30/2019 1554   GLUCOSE 78 08/30/2019 1500   BUN 11 08/30/2019 1554   BUN 13 08/30/2019 1500   CREATININE 0.80 08/30/2019 1554   CALCIUM 9.0 08/30/2019 1554   PROT 7.1 08/30/2019 1500   ALBUMIN 4.7 08/30/2019 1500   AST 23 08/30/2019 1500   ALT 15 08/30/2019 1500   ALKPHOS 72 08/30/2019 1500   BILITOT 0.4 08/30/2019 1500   GFRNONAA >60 08/30/2019 1554   GFRAA >60 08/30/2019 1554   Lab Results  Component Value Date   CHOL 193 08/30/2019   HDL 66 08/30/2019   LDLCALC 114 (H) 08/30/2019   TRIG 68 08/30/2019   CHOLHDL 2.9 08/30/2019   Lab Results  Component Value Date   HGBA1C 4.5 (L) 08/30/2019   Lab Results  Component Value Date   VITAMINB12 470 10/20/2019   Lab Results  Component Value Date   TSH 0.787 10/20/2017      ASSESSMENT AND PLAN 44 y.o. year old female  has a past medical history of Back pain, Bell's palsy, and Hypertension. here with  ***   I spent 15 minutes with the patient. 50% of this time was spent   Butler Denmark, [place], DNP 12/29/2019, 5:41 AM [facility] [address], [place], [place] [address] [phone]

## 2020-01-27 DIAGNOSIS — Z419 Encounter for procedure for purposes other than remedying health state, unspecified: Secondary | ICD-10-CM | POA: Diagnosis not present

## 2020-02-27 DIAGNOSIS — Z419 Encounter for procedure for purposes other than remedying health state, unspecified: Secondary | ICD-10-CM | POA: Diagnosis not present

## 2020-03-28 DIAGNOSIS — Z419 Encounter for procedure for purposes other than remedying health state, unspecified: Secondary | ICD-10-CM | POA: Diagnosis not present

## 2020-04-28 DIAGNOSIS — Z419 Encounter for procedure for purposes other than remedying health state, unspecified: Secondary | ICD-10-CM | POA: Diagnosis not present

## 2020-05-29 DIAGNOSIS — Z419 Encounter for procedure for purposes other than remedying health state, unspecified: Secondary | ICD-10-CM | POA: Diagnosis not present

## 2020-06-26 DIAGNOSIS — Z419 Encounter for procedure for purposes other than remedying health state, unspecified: Secondary | ICD-10-CM | POA: Diagnosis not present

## 2020-07-27 DIAGNOSIS — Z419 Encounter for procedure for purposes other than remedying health state, unspecified: Secondary | ICD-10-CM | POA: Diagnosis not present

## 2020-08-14 ENCOUNTER — Ambulatory Visit: Payer: Self-pay | Admitting: *Deleted

## 2020-08-14 NOTE — Telephone Encounter (Signed)
Pt called in c/o her BP being elevated and her finger tips on both hands her thumbs, index fingers and middle fingers being blue and numb. She checked her BP at [facility] and at home with her cuff and was getting very elevated readings.  The systolic being very high.   "I don't remember what my bottom numbers were but they were high but the top numbers were real high".    "My fingers get like this when my BP is high.   She c/o her vision being very blurry and having occasional headaches.  She has been off of her BP medications for 4-5 months due to losing her job and not having insurance or a way to pay for her medication.    Due to her symptoms and per the protocol I advised her to go to the ED.  I was explaining why and she hung up.    I sent my notes to [facility] and wellness for [name], NP information.    Reason for Disposition . [9] Systolic BP  >= 563 OR Diastolic >= 875 AND [6] cardiac or neurologic symptoms (e.g., chest pain, difficulty breathing, unsteady gait, blurred vision)    Finger tips on both hands blue and numb from BP being elevated.   Been without BP medications for 4-5 months  Answer Assessment - Initial Assessment Questions 1. BLOOD PRESSURE: "What is the blood pressure?" "Did you take at least two measurements 5 minutes apart?"     BP is high.   I go to [employer].   200/   At home 198 2. ONSET: "When did you take your blood pressure?"  My index finger, thumb and middle fingers on both hands are blue.  My BP makes it happen.   I've been out of BP medication for 4-5 months due to I lost I job.    I'm having blurry vision.   I just close my eyes are refocus.     [facility] 200/119 yesterday.    My home cuff is 198/79 I think.   3. HOW: "How did you obtain the blood pressure?" (e.g., visiting nurse, automatic home BP monitor)     [facility] and my home cuff 4. HISTORY: "Do you have a history of high blood pressure?"     Yes 5. MEDICATIONS: "Are  you taking any medications for blood pressure?" "Have you missed any doses recently?"     No been out of meds for 4-5 months due to I lost my job. 6. OTHER SYMPTOMS: "Do you have any symptoms?" (e.g., headache, chest pain, blurred vision, difficulty breathing, weakness)     Occasional headache, blurred vision, my fingers are blue, numb and tingling for a week now.   7. PREGNANCY: "Is there any chance you are pregnant?" "When was your last menstrual period?"     Not asked  Protocols used: BLOOD PRESSURE - HIGH-A-AH

## 2020-08-15 NOTE — Telephone Encounter (Signed)
Will forward to provider  

## 2020-08-16 NOTE — Telephone Encounter (Signed)
This is emergent and should be evaluated as such at urgent care or emergency room if her fingers are turning blue.

## 2020-08-16 NOTE — Telephone Encounter (Signed)
I also have not seen her in over a year. She needs to make an appointment. It looks like she sees angela in June

## 2020-08-17 NOTE — Telephone Encounter (Signed)
Returned pt call and went over provider response. Pt states her fingers are still blue. Pt states went she gets off work she will go to the urgent care. Pt doesn't have any questions or concerns

## 2020-08-26 DIAGNOSIS — Z419 Encounter for procedure for purposes other than remedying health state, unspecified: Secondary | ICD-10-CM | POA: Diagnosis not present

## 2020-09-06 ENCOUNTER — Encounter: Payer: Self-pay | Admitting: Physician Assistant

## 2020-09-06 ENCOUNTER — Other Ambulatory Visit: Payer: Self-pay

## 2020-09-06 ENCOUNTER — Ambulatory Visit: Payer: No Typology Code available for payment source | Attending: Physician Assistant | Admitting: Physician Assistant

## 2020-09-06 VITALS — BP 149/86 | HR 70 | Resp 18 | Ht 66.0 in | Wt 171.4 lb

## 2020-09-06 DIAGNOSIS — H539 Unspecified visual disturbance: Secondary | ICD-10-CM | POA: Diagnosis not present

## 2020-09-06 DIAGNOSIS — N951 Menopausal and female climacteric states: Secondary | ICD-10-CM | POA: Diagnosis not present

## 2020-09-06 DIAGNOSIS — E559 Vitamin D deficiency, unspecified: Secondary | ICD-10-CM | POA: Diagnosis not present

## 2020-09-06 DIAGNOSIS — R232 Flushing: Secondary | ICD-10-CM | POA: Diagnosis not present

## 2020-09-06 DIAGNOSIS — E785 Hyperlipidemia, unspecified: Secondary | ICD-10-CM | POA: Diagnosis not present

## 2020-09-06 DIAGNOSIS — G629 Polyneuropathy, unspecified: Secondary | ICD-10-CM

## 2020-09-06 DIAGNOSIS — I10 Essential (primary) hypertension: Secondary | ICD-10-CM | POA: Diagnosis not present

## 2020-09-06 LAB — GLUCOSE, POCT (MANUAL RESULT ENTRY): POC Glucose: 93 mg/dl (ref 70–99)

## 2020-09-06 MED ORDER — VENLAFAXINE HCL ER 37.5 MG PO CP24
37.5000 mg | ORAL_CAPSULE | Freq: Every day | ORAL | 2 refills | Status: DC
  Filled 2020-09-06: qty 30, 30d supply, fill #0
  Filled 2020-10-25: qty 30, 30d supply, fill #1

## 2020-09-06 MED ORDER — AMLODIPINE BESYLATE 10 MG PO TABS
10.0000 mg | ORAL_TABLET | Freq: Every day | ORAL | 1 refills | Status: DC
  Filled 2020-09-06: qty 90, 90d supply, fill #0

## 2020-09-06 MED ORDER — AMLODIPINE BESYLATE 10 MG PO TABS: 10.0000 mg | ORAL_TABLET | Freq: Every day | ORAL | 1 refills | Status: DC

## 2020-09-06 MED ORDER — CLONIDINE HCL 0.1 MG PO TABS
0.1000 mg | ORAL_TABLET | Freq: Once | ORAL | Status: AC
  Administered 2020-09-06: 0.1 mg via ORAL

## 2020-09-06 MED ORDER — HYDROCHLOROTHIAZIDE 25 MG PO TABS
25.0000 mg | ORAL_TABLET | Freq: Every day | ORAL | 3 refills | Status: DC
Start: 2020-09-06 — End: 2020-12-07
  Filled 2020-09-06: qty 90, 90d supply, fill #0

## 2020-09-06 MED ORDER — ATORVASTATIN CALCIUM 20 MG PO TABS
20.0000 mg | ORAL_TABLET | Freq: Every day | ORAL | 3 refills | Status: DC
  Filled 2020-09-06: qty 90, 90d supply, fill #0

## 2020-09-06 MED ORDER — GABAPENTIN 300 MG PO CAPS
300.0000 mg | ORAL_CAPSULE | Freq: Three times a day (TID) | ORAL | 11 refills | Status: DC
  Filled 2020-09-06: qty 90, 30d supply, fill #0

## 2020-09-06 MED ORDER — HYDROCHLOROTHIAZIDE 25 MG PO TABS: 25.0000 mg | ORAL_TABLET | Freq: Every day | ORAL | 3 refills | Status: DC

## 2020-09-06 MED ORDER — ATORVASTATIN CALCIUM 20 MG PO TABS: 20.0000 mg | ORAL_TABLET | Freq: Every day | ORAL | 3 refills | Status: DC

## 2020-09-06 MED ORDER — GABAPENTIN 300 MG PO CAPS: 300.0000 mg | ORAL_CAPSULE | Freq: Three times a day (TID) | ORAL | 11 refills | Status: DC

## 2020-09-06 NOTE — Progress Notes (Signed)
Anna Walters, is a 45 y.o. female  UXL:[id number]  ZDG:[id number]  DOB - 12-08-1975  Subjective:  Chief Complaint and HPI: Anna Walters is a 45 y.o. female here today to get back on track with her health.  She has been out of all of her meds for at least 5-6 months.  She is s/p uterine ablation about 2 years ago.  She started having hot flashes about 7-8 months ago, trouble sleeping, and some mood swings and depression.  Intermittent dizziness for several months when she goes from sitting to standing.    She also c/o blurry vision on and off for months.  No CP/no SOB.     ROS:   Constitutional:  No f/c, No night sweats, No unexplained weight loss. EENT:  + vision changes, + blurry vision, No hearing changes. No mouth, throat, or ear problems.  Respiratory: No cough, No SOB Cardiac: No CP, no palpitations GI:  No abd pain, No N/V/D. GU: No Urinary s/sx Musculoskeletal: No joint pain Neuro: No headache, intermittent dizziness, no motor weakness.  Skin: No rash Endocrine:  No polydipsia. No polyuria.  Psych: Denies SI/HI  No problems updated.  ALLERGIES: No Known Allergies  PAST MEDICAL HISTORY: Past Medical History:  Diagnosis Date  . Back pain   . Bell's palsy   . Hypertension     MEDICATIONS AT HOME: Prior to Admission medications   Medication Sig Start Date End Date Taking? Authorizing Provider  Acetaminophen (TYLENOL PO) Take by mouth.   Yes [provider]  Blood Pressure Monitor DEVI Please provide patient with insurance approved blood pressure monitor. I10.0 09/07/19  Yes Gildardo Pounds, NP  Cholecalciferol (VITAMIN D3) 1.25 MG (50000 UT) CAPS Take 1 capsule by mouth once a week. 10/24/19  Yes Marcial Pacas, MD  amLODipine (NORVASC) 10 MG tablet Take 1 tablet (10 mg total) by mouth daily. 09/06/20 12/05/20  Argentina Donovan, PA-C  atorvastatin (LIPITOR) 20 MG tablet Take 1 tablet (20 mg total) by mouth daily. 09/06/20   Argentina Donovan, PA-C  gabapentin  (NEURONTIN) 300 MG capsule Take 1 capsule (300 mg total) by mouth 3 (three) times daily. 09/06/20   Argentina Donovan, PA-C  hydrochlorothiazide (HYDRODIURIL) 25 MG tablet Take 1 tablet (25 mg total) by mouth daily. 09/06/20   Argentina Donovan, PA-C  omeprazole (PRILOSEC) 20 MG capsule Take 1 capsule (20 mg total) by mouth daily for 10 days. 08/30/19 09/09/19  Gildardo Pounds, NP     Objective:  EXAM:   Vitals:   09/06/20 1357  BP: (!) 157/119  Pulse: 70  Resp: 18  SpO2: 100%  Weight: 171 lb 6.4 oz (77.7 kg)  Height: 5\' 6"  (1.676 m)   Recheck BP after clonidine 149/86, pulse 72 General appearance : A&OX3. NAD. Non-toxic-appearing HEENT: Atraumatic and Normocephalic.  PERRLA. EOM intact. Neck: supple, no JVD. No cervical lymphadenopathy. No thyromegaly Chest/Lungs:  Breathing-non-labored, Good air entry bilaterally, breath sounds normal without rales, rhonchi, or wheezing  CVS: S1 S2 regular, no murmurs, gallops, rubs  Extremities: Bilateral Lower Ext shows no edema, both legs are warm to touch with = pulse throughout Neurology:  CN II-XII grossly intact, Non focal.   Psych:  TP linear. J/I WNL. Normal speech. Appropriate eye contact and affect.  Skin:  No Rash  Data Review Lab Results  Component Value Date   HGBA1C 4.5 (L) 08/30/2019     Assessment & Plan   1. Essential hypertension uncontrolled-out of meds X5-6 months.  Resume meds and check BP daily and record and have available for your next visit We have discussed target BP range and blood pressure goal. I have advised patient to check BP regularly and to call us back or report to clinic if the numbers are consistently higher than 140/90. We discussed the importance of compliance with medical therapy and DASH diet recommended, consequences of uncontrolled hypertension discussed.  - Comprehensive metabolic panel - CBC with Differential/Platelet - cloNIDine (CATAPRES) tablet 0.1 mg - amLODipine (NORVASC) 10 MG tablet; Take 1  tablet (10 mg total) by mouth daily.  Dispense: 90 tablet; Refill: 1 - hydrochlorothiazide (HYDRODIURIL) 25 MG tablet; Take 1 tablet (25 mg total) by mouth daily.  Dispense: 90 tablet; Refill: 3 - Ambulatory referral to Ophthalmology - Hemoglobin A1c  2. Hot flashes Consider clonidine if BP is still high at follow up to help with BP and hot flashes.   - TSH - FSH/LH -start low dose effexor 37.5 and titrate as needed  3. Menopausal symptom effexor - TSH - FSH/LH  4. Vitamin D deficiency - Vitamin D, 25-hydroxy  5. Dyslipidemia, goal LDL below 70 - Lipid panel - atorvastatin (LIPITOR) 20 MG tablet; Take 1 tablet (20 mg total) by mouth daily.  Dispense: 90 tablet; Refill: 3  6. Neuropathy - gabapentin (NEURONTIN) 300 MG capsule; Take 1 capsule (300 mg total) by mouth 3 (three) times daily.  Dispense: 90 capsule; Refill: 11 - Hemoglobin A1c  7. Vision changes Not acute/no red flags - Glucose (CBG) - Ambulatory referral to Ophthalmology - Hemoglobin A1c   Patient have been counseled extensively about nutrition and exercise  Return for 3-4 weeks with Lurena Joiner;  3 months with PCP.  The patient was given clear instructions to go to ER or return to medical center if symptoms don't improve, worsen or new problems develop. The patient verbalized understanding. The patient was told to call to get lab results if they haven't heard anything in the next week.     Freeman Caldron, PA-C [facility] and [address] [place], [place]   09/06/2020, 2:11 PMPatient ID: [name], female   DOB: [birth year], 45 y.o.   MRN: [id number]

## 2020-09-06 NOTE — Patient Instructions (Addendum)
Check blood pressure daily and record and have available for next visit for review  Drink 80-100 ounces water daily   Hypertension, Adult Hypertension is another name for high blood pressure. High blood pressure forces your heart to work harder to pump blood. This can cause problems over time. There are two numbers in a blood pressure reading. There is a top number (systolic) over a bottom number (diastolic). It is best to have a blood pressure that is below 120/80. Healthy choices can help lower your blood pressure, or you may need medicine to help lower it. What are the causes? The cause of this condition is not known. Some conditions may be related to high blood pressure. What increases the risk?  Smoking.  Having type 2 diabetes mellitus, high cholesterol, or both.  Not getting enough exercise or physical activity.  Being overweight.  Having too much fat, sugar, calories, or salt (sodium) in your diet.  Drinking too much alcohol.  Having long-term (chronic) kidney disease.  Having a family history of high blood pressure.  Age. Risk increases with age.  Race. You may be at higher risk if you are African American.  Gender. Men are at higher risk than women before age 51. After age 54, women are at higher risk than men.  Having obstructive sleep apnea.  Stress. What are the signs or symptoms?  High blood pressure may not cause symptoms. Very high blood pressure (hypertensive crisis) may cause: ? Headache. ? Feelings of worry or nervousness (anxiety). ? Shortness of breath. ? Nosebleed. ? A feeling of being sick to your stomach (nausea). ? Throwing up (vomiting). ? Changes in how you see. ? Very bad chest pain. ? Seizures. How is this treated?  This condition is treated by making healthy lifestyle changes, such as: ? Eating healthy foods. ? Exercising more. ? Drinking less alcohol.  Your health care provider may prescribe medicine if lifestyle changes are not  enough to get your blood pressure under control, and if: ? Your top number is above 130. ? Your bottom number is above 80.  Your personal target blood pressure may vary. Follow these instructions at home: Eating and drinking  If told, follow the DASH eating plan. To follow this plan: ? Fill one half of your plate at each meal with fruits and vegetables. ? Fill one fourth of your plate at each meal with whole grains. Whole grains include whole-wheat pasta, brown rice, and whole-grain bread. ? Eat or drink low-fat dairy products, such as skim milk or low-fat yogurt. ? Fill one fourth of your plate at each meal with low-fat (lean) proteins. Low-fat proteins include fish, chicken without skin, eggs, beans, and tofu. ? Avoid fatty meat, cured and processed meat, or chicken with skin. ? Avoid pre-made or processed food.  Eat less than 1,500 mg of salt each day.  Do not drink alcohol if: ? Your doctor tells you not to drink. ? You are pregnant, may be pregnant, or are planning to become pregnant.  If you drink alcohol: ? Limit how much you use to:  0-1 drink a day for women.  0-2 drinks a day for men. ? Be aware of how much alcohol is in your drink. In the [place] one drink equals one 12 oz bottle of beer (355 mL), one 5 oz glass of wine (148 mL), or one 1 oz glass of hard liquor (44 mL).   Lifestyle  Work with your doctor to stay at a healthy weight or  to lose weight. Ask your doctor what the best weight is for you.  Get at least 30 minutes of exercise most days of the week. This may include walking, swimming, or biking.  Get at least 30 minutes of exercise that strengthens your muscles (resistance exercise) at least 3 days a week. This may include lifting weights or doing Pilates.  Do not use any products that contain nicotine or tobacco, such as cigarettes, e-cigarettes, and chewing tobacco. If you need help quitting, ask your doctor.  Check your blood pressure at home as told by  your doctor.  Keep all follow-up visits as told by your doctor. This is important.   Medicines  Take over-the-counter and prescription medicines only as told by your doctor. Follow directions carefully.  Do not skip doses of blood pressure medicine. The medicine does not work as well if you skip doses. Skipping doses also puts you at risk for problems.  Ask your doctor about side effects or reactions to medicines that you should watch for. Contact a doctor if you:  Think you are having a reaction to the medicine you are taking.  Have headaches that keep coming back (recurring).  Feel dizzy.  Have swelling in your ankles.  Have trouble with your vision. Get help right away if you:  Get a very bad headache.  Start to feel mixed up (confused).  Feel weak or numb.  Feel faint.  Have very bad pain in your: ? Chest. ? Belly (abdomen).  Throw up more than once.  Have trouble breathing. Summary  Hypertension is another name for high blood pressure.  High blood pressure forces your heart to work harder to pump blood.  For most people, a normal blood pressure is less than 120/80.  Making healthy choices can help lower blood pressure. If your blood pressure does not get lower with healthy choices, you may need to take medicine. This information is not intended to replace advice given to you by your health care provider. Make sure you discuss any questions you have with your health care provider. Document Revised: 12/23/2017 Document Reviewed: 12/23/2017 Elsevier Patient Education  2021 [employer].

## 2020-09-07 LAB — LIPID PANEL
Chol/HDL Ratio: 3.2 ratio (ref 0.0–4.4)
Cholesterol, Total: 196 mg/dL (ref 100–199)
HDL: 61 mg/dL (ref >39)
LDL Chol Calc (NIH): 121 mg/dL — ABNORMAL HIGH (ref 0–99)
Triglycerides: 77 mg/dL (ref 0–149)
VLDL Cholesterol Cal: 14 mg/dL (ref 5–40)

## 2020-09-07 LAB — COMPREHENSIVE METABOLIC PANEL
ALT: 13 IU/L (ref 0–32)
AST: 13 IU/L (ref 0–40)
Albumin/Globulin Ratio: 1.7 (ref 1.2–2.2)
Albumin: 4.7 g/dL (ref 3.8–4.8)
Alkaline Phosphatase: 71 IU/L (ref 44–121)
BUN/Creatinine Ratio: 12 (ref 9–23)
BUN: 11 mg/dL (ref 6–24)
Bilirubin Total: 0.5 mg/dL (ref 0.0–1.2)
CO2: 23 mmol/L (ref 20–29)
Calcium: 9.6 mg/dL (ref 8.7–10.2)
Chloride: 104 mmol/L (ref 96–106)
Creatinine, Ser: 0.94 mg/dL (ref 0.57–1.00)
Globulin, Total: 2.7 g/dL (ref 1.5–4.5)
Glucose: 84 mg/dL (ref 65–99)
Potassium: 4.5 mmol/L (ref 3.5–5.2)
Sodium: 141 mmol/L (ref 134–144)
Total Protein: 7.4 g/dL (ref 6.0–8.5)
eGFR: 75 mL/min/{1.73_m2} (ref >59)

## 2020-09-07 LAB — FSH/LH
FSH: 53.2 m[IU]/mL
LH: 31.9 m[IU]/mL

## 2020-09-07 LAB — CBC WITH DIFFERENTIAL/PLATELET
Basophils Absolute: 0 10*3/uL (ref 0.0–0.2)
Basos: 1 %
EOS (ABSOLUTE): 0.1 10*3/uL (ref 0.0–0.4)
Eos: 2 %
Hematocrit: 39.2 % (ref 34.0–46.6)
Hemoglobin: 13.1 g/dL (ref 11.1–15.9)
Immature Grans (Abs): 0 10*3/uL (ref 0.0–0.1)
Immature Granulocytes: 0 %
Lymphocytes Absolute: 2.5 10*3/uL (ref 0.7–3.1)
Lymphs: 39 %
MCH: 34.7 pg — ABNORMAL HIGH (ref 26.6–33.0)
MCHC: 33.4 g/dL (ref 31.5–35.7)
MCV: 104 fL — ABNORMAL HIGH (ref 79–97)
Monocytes Absolute: 0.4 10*3/uL (ref 0.1–0.9)
Monocytes: 6 %
Neutrophils Absolute: 3.4 10*3/uL (ref 1.4–7.0)
Neutrophils: 52 %
Platelets: 311 10*3/uL (ref 150–450)
RBC: 3.78 x10E6/uL (ref 3.77–5.28)
RDW: 11.6 % — ABNORMAL LOW (ref 11.7–15.4)
WBC: 6.4 10*3/uL (ref 3.4–10.8)

## 2020-09-07 LAB — HEMOGLOBIN A1C
Est. average glucose Bld gHb Est-mCnc: 94 mg/dL
Hgb A1c MFr Bld: 4.9 % (ref 4.8–5.6)

## 2020-09-07 LAB — TSH: TSH: 0.555 u[IU]/mL (ref 0.450–4.500)

## 2020-09-07 LAB — VITAMIN D 25 HYDROXY (VIT D DEFICIENCY, FRACTURES): Vit D, 25-Hydroxy: 31.6 ng/mL (ref 30.0–100.0)

## 2020-09-13 ENCOUNTER — Telehealth (INDEPENDENT_AMBULATORY_CARE_PROVIDER_SITE_OTHER): Payer: Self-pay

## 2020-09-13 NOTE — Telephone Encounter (Signed)
-----   Message from Argentina Donovan, [place] sent at 09/11/2020  3:40 PM EDT ----- Eat heart healthy diet as LDL cholesterol is elevated.  Your labs do show menopause and is what is causing the hot flashes.  Take blood pressures  and meds as we discussed and follow up as planned.  You may need to see your gynecologist to review hot flash options if the effexor does not help with menopausal symptoms.  Your thyroid is normal.  Follow up as planned.  Thanks, Freeman Caldron, PA-C

## 2020-09-13 NOTE — Telephone Encounter (Signed)
Spoke with patient. After verifying date of birth she is aware of all labs results. Nat Christen, CMA

## 2020-09-26 ENCOUNTER — Ambulatory Visit: Payer: No Typology Code available for payment source | Admitting: Physician Assistant

## 2020-09-26 DIAGNOSIS — Z419 Encounter for procedure for purposes other than remedying health state, unspecified: Secondary | ICD-10-CM | POA: Diagnosis not present

## 2020-09-27 ENCOUNTER — Other Ambulatory Visit: Payer: Self-pay

## 2020-09-27 ENCOUNTER — Ambulatory Visit: Payer: No Typology Code available for payment source | Attending: Nurse Practitioner | Admitting: Pharmacist

## 2020-09-27 ENCOUNTER — Encounter: Payer: Self-pay | Admitting: Pharmacist

## 2020-09-27 VITALS — BP 138/82

## 2020-09-27 DIAGNOSIS — I10 Essential (primary) hypertension: Secondary | ICD-10-CM

## 2020-09-27 NOTE — Progress Notes (Signed)
   S:    PCP: Zelda   Patient arrives in good spirits. Presents to the clinic for hypertension evaluation, counseling, and management. Patient was referred on 09/06/2020 by Levada Dy. BP at that visit was 149/86, however, pt reported that she was out of her medications 5-6 months prior to that visit. Levada Dy restarted pt's BP medications.   Medication adherence reported, however, she has not taken her medications todaty. Pt denies chest pain, dyspnea, HA, blurred vision.   Current BP Medications include:  amlodipine 10 mg daily, HCTZ 25 mg daily  Dietary habits include: tries to limit sodium but admits to snacking on chips; drinks coffee daily  Exercise habits include: none  Family / Social history:  FHx: HTN Tobacco: current 0.25 PPD smoker Alcohol: denies current use    O:  Vitals:   09/27/20 1615  BP: 138/82   Home BP readings: none  Last 3 Office BP readings: BP Readings from Last 3 Encounters:  09/27/20 138/82  09/06/20 (!) 149/86  10/20/19 (!) 190/98    BMET    Component Value Date/Time   NA 141 09/06/2020 1432   K 4.5 09/06/2020 1432   CL 104 09/06/2020 1432   CO2 23 09/06/2020 1432   GLUCOSE 84 09/06/2020 1432   GLUCOSE 85 08/30/2019 1554   BUN 11 09/06/2020 1432   CREATININE 0.94 09/06/2020 1432   CALCIUM 9.6 09/06/2020 1432   GFRNONAA >60 08/30/2019 1554   GFRAA >60 08/30/2019 1554    Renal function: CrCl cannot be calculated (Patient's most recent lab result is older than the maximum 21 days allowed.).  Clinical ASCVD: No  The 10-year ASCVD risk score Mikey Bussing DC Jr., et al., 2013) is: 6.2%   Values used to calculate the score:     Age: 45 years     Sex: Female     Is Non-Hispanic African American: Yes     Diabetic: No     Tobacco smoker: Yes     Systolic Blood Pressure: 854 mmHg     Is BP treated: Yes     HDL Cholesterol: 61 mg/dL     Total Cholesterol: 196 mg/dL   A/P: Hypertension longstanding currently close to goal on current medications. BP  Goal = < 130/80 mmHg. Medication adherence reported, however, she has not taken her medications today. No med changes today.  -Continued current regimen.  -Counseled on lifestyle modifications for blood pressure control including reduced dietary sodium, increased exercise, adequate sleep.  Results reviewed and written information provided.   Total time in face-to-face counseling 15 minutes.  F/U Clinic Visit in 3 weeks.  Benard Halsted, PharmD, [facility] [place], [place] [phone]

## 2020-10-18 ENCOUNTER — Ambulatory Visit: Payer: No Typology Code available for payment source | Admitting: Pharmacist

## 2020-10-25 ENCOUNTER — Other Ambulatory Visit: Payer: Self-pay

## 2020-10-26 DIAGNOSIS — Z419 Encounter for procedure for purposes other than remedying health state, unspecified: Secondary | ICD-10-CM | POA: Diagnosis not present

## 2020-10-30 ENCOUNTER — Other Ambulatory Visit: Payer: Self-pay

## 2020-11-02 ENCOUNTER — Ambulatory Visit: Payer: Self-pay | Admitting: *Deleted

## 2020-11-02 ENCOUNTER — Telehealth: Payer: Self-pay | Admitting: Nurse Practitioner

## 2020-11-02 NOTE — Telephone Encounter (Signed)
Reason for Disposition  Abdominal pain is a chronic symptom (recurrent or ongoing AND present > 4 weeks)    Known 5 uterine fibroids   Abd cramping left lower abd radiating into left thigh.  Answer Assessment - Initial Assessment Questions 1. LOCATION: "Where does it hurt?"      I'm having uterine pain in the center and to the left side of my abd. 2. RADIATION: "Does the pain shoot anywhere else?" (e.g., chest, back)     Radiating into my left leg thigh area.   It would do that when I was having my periods.   I'm no longer having periods for 2 yrs. 3. ONSET: "When did the pain begin?" (e.g., minutes, hours or days ago)      Tylenol is not helping.   2 days this pain has been going on.   I have uterine fibroids 5 of them. 4. SUDDEN: "Gradual or sudden onset?"     Gradually started and getting worse.    5. PATTERN "Does the pain come and go, or is it constant?"    - If constant: "Is it getting better, staying the same, or worsening?"      (Note: Constant means the pain never goes away completely; most serious pain is constant and it progresses)     - If intermittent: "How long does it last?" "Do you have pain now?"     (Note: Intermittent means the pain goes away completely between bouts)     Intermittent like when I had my periods. 6. SEVERITY: "How bad is the pain?"  (e.g., Scale 1-10; mild, moderate, or severe)   - MILD (1-3): doesn't interfere with normal activities, abdomen soft and not tender to touch    - MODERATE (4-7): interferes with normal activities or awakens from sleep, abdomen tender to touch    - SEVERE (8-10): excruciating pain, doubled over, unable to do any normal activities      7 all day   10 at the worst 7. RECURRENT SYMPTOM: "Have you ever had this type of stomach pain before?" If Yes, ask: "When was the last time?" and "What happened that time?"      Yes uterine fibroids 8. CAUSE: "What do you think is causing the stomach pain?"     Fibroids.   9.  RELIEVING/AGGRAVATING FACTORS: "What makes it better or worse?" (e.g., movement, antacids, bowel movement)     Sometimes the pain going into my left thigh makes it hard to walk. 10. OTHER SYMPTOMS: "Do you have any other symptoms?" (e.g., back pain, diarrhea, fever, urination pain, vomiting)       No.  11. PREGNANCY: "Is there any chance you are pregnant?" "When was your last menstrual period?"       N/A  Protocols used: Abdominal Pain - [facility]

## 2020-11-02 NOTE — Telephone Encounter (Signed)
Noted  

## 2020-11-02 NOTE — Telephone Encounter (Signed)
Pt calling in c/o having abd pain in center lower abd and to the left side radiating down into her left thigh.   She has 5 uterine fibroids and has had this pain before.  Over the last 2 days the pain has been worse even with Tylenol.   No vaginal bleeding.  Not had a period for 2 yrs.  The pain started gradually 2 days ago and intermittently has gotten worse.   "It's cramping like when I used to have my cycles"s. Denies any other symptoms like diarrhea or nausea.  There are no appts available with [facility] within the protocol timeframe to be seen of 2 wks.   She has an appt with [name], NP on December 07, 2020 at 4:10.   No appts between now and then.   Pt agreeable to waiting until that appt since she knows it's the fibroids hurting.    I instructed her to go to the urgent care or ED if the pain becomes more severe or the Tylenol is not helping at all.   She was agreeable to this plan.

## 2020-11-08 ENCOUNTER — Telehealth: Payer: Self-pay | Admitting: Nurse Practitioner

## 2020-11-08 NOTE — Telephone Encounter (Signed)
Copied from [place] [id number]. Topic: General - Inquiry >> Nov 05, 2020  9:54 AM Greggory Keen D wrote: Reason for CRM: Pt called asking if she could get a referral to an obgyn.  She called and  Talked to a triage nurse last week about her symptoms and hs an appt in aug.  /she is just wanting to know if she can get  referral to gyn  CB#  [phone] >> Nov 05, 2020 12:52 PM Oneta Rack wrote: Patient would like referral for OBGYN expedited.

## 2020-11-09 NOTE — Telephone Encounter (Signed)
Pt was called and a VM was left informing patient to return phone call to get more information on the need for the referral.

## 2020-11-26 DIAGNOSIS — Z419 Encounter for procedure for purposes other than remedying health state, unspecified: Secondary | ICD-10-CM | POA: Diagnosis not present

## 2020-12-07 ENCOUNTER — Encounter: Payer: Self-pay | Admitting: Nurse Practitioner

## 2020-12-07 ENCOUNTER — Other Ambulatory Visit: Payer: Self-pay

## 2020-12-07 ENCOUNTER — Ambulatory Visit: Payer: No Typology Code available for payment source | Attending: Nurse Practitioner | Admitting: Nurse Practitioner

## 2020-12-07 VITALS — BP 162/97 | HR 65 | Temp 98.5°F | Ht 64.0 in | Wt 169.4 lb

## 2020-12-07 DIAGNOSIS — Z1231 Encounter for screening mammogram for malignant neoplasm of breast: Secondary | ICD-10-CM

## 2020-12-07 DIAGNOSIS — N941 Unspecified dyspareunia: Secondary | ICD-10-CM

## 2020-12-07 DIAGNOSIS — I10 Essential (primary) hypertension: Secondary | ICD-10-CM

## 2020-12-07 DIAGNOSIS — E785 Hyperlipidemia, unspecified: Secondary | ICD-10-CM | POA: Diagnosis not present

## 2020-12-07 DIAGNOSIS — K219 Gastro-esophageal reflux disease without esophagitis: Secondary | ICD-10-CM | POA: Diagnosis not present

## 2020-12-07 DIAGNOSIS — G629 Polyneuropathy, unspecified: Secondary | ICD-10-CM | POA: Diagnosis not present

## 2020-12-07 DIAGNOSIS — F32A Depression, unspecified: Secondary | ICD-10-CM | POA: Diagnosis not present

## 2020-12-07 DIAGNOSIS — F419 Anxiety disorder, unspecified: Secondary | ICD-10-CM

## 2020-12-07 DIAGNOSIS — Z1211 Encounter for screening for malignant neoplasm of colon: Secondary | ICD-10-CM

## 2020-12-07 MED ORDER — ATORVASTATIN CALCIUM 20 MG PO TABS
20.0000 mg | ORAL_TABLET | Freq: Every day | ORAL | 3 refills | Status: AC
  Filled 2020-12-07: qty 30, 30d supply, fill #0

## 2020-12-07 MED ORDER — VENLAFAXINE HCL ER 37.5 MG PO CP24
37.5000 mg | ORAL_CAPSULE | Freq: Every day | ORAL | 2 refills | Status: AC
  Filled 2020-12-07: qty 30, 30d supply, fill #0
  Filled 2021-02-25: qty 30, 30d supply, fill #1
  Filled 2021-06-18: qty 30, 30d supply, fill #0
  Filled 2021-06-18: qty 30, 30d supply, fill #2

## 2020-12-07 MED ORDER — HYDROCHLOROTHIAZIDE 25 MG PO TABS: 25.0000 mg | ORAL_TABLET | Freq: Every day | ORAL | 3 refills | Status: DC

## 2020-12-07 MED ORDER — VENLAFAXINE HCL ER 37.5 MG PO CP24: 37.5000 mg | ORAL_CAPSULE | Freq: Every day | ORAL | 2 refills | Status: DC

## 2020-12-07 MED ORDER — ATORVASTATIN CALCIUM 20 MG PO TABS: 20.0000 mg | ORAL_TABLET | Freq: Every day | ORAL | 3 refills | Status: DC

## 2020-12-07 MED ORDER — LISINOPRIL-HYDROCHLOROTHIAZIDE 20-25 MG PO TABS
1.0000 | ORAL_TABLET | Freq: Every day | ORAL | 3 refills | Status: AC
  Filled 2020-12-07 – 2021-06-18 (×2): qty 30, 30d supply, fill #0
  Filled 2021-06-18: qty 30, 30d supply, fill #1
  Filled 2021-07-09: qty 30, 30d supply, fill #0

## 2020-12-07 MED ORDER — GABAPENTIN 300 MG PO CAPS
300.0000 mg | ORAL_CAPSULE | Freq: Three times a day (TID) | ORAL | 11 refills | Status: AC
  Filled 2020-12-07: qty 90, 30d supply, fill #0

## 2020-12-07 MED ORDER — AMLODIPINE BESYLATE 10 MG PO TABS: 10.0000 mg | ORAL_TABLET | Freq: Every day | ORAL | 1 refills | Status: DC

## 2020-12-07 MED ORDER — OMEPRAZOLE 20 MG PO CPDR: 20.0000 mg | DELAYED_RELEASE_CAPSULE | Freq: Every day | ORAL | 1 refills | Status: DC

## 2020-12-07 MED ORDER — HYDROCHLOROTHIAZIDE 25 MG PO TABS
25.0000 mg | ORAL_TABLET | Freq: Every day | ORAL | 3 refills | Status: DC
  Filled 2020-12-07: qty 90, 90d supply, fill #0

## 2020-12-07 MED ORDER — OMEPRAZOLE 20 MG PO CPDR
20.0000 mg | DELAYED_RELEASE_CAPSULE | Freq: Every day | ORAL | 1 refills | Status: AC
  Filled 2020-12-07 – 2021-06-18 (×2): qty 30, 30d supply, fill #0
  Filled 2021-06-18: qty 30, 30d supply, fill #1

## 2020-12-07 MED ORDER — AMLODIPINE BESYLATE 10 MG PO TABS
10.0000 mg | ORAL_TABLET | Freq: Every day | ORAL | 1 refills | Status: AC
  Filled 2020-12-07 – 2021-07-09 (×2): qty 30, 30d supply, fill #0

## 2020-12-07 MED ORDER — GABAPENTIN 300 MG PO CAPS: 300.0000 mg | ORAL_CAPSULE | Freq: Three times a day (TID) | ORAL | 11 refills | Status: DC

## 2020-12-07 NOTE — Progress Notes (Signed)
Assessment & Plan:  Maleeha was seen today for hypertension.  Diagnoses and all orders for this visit:  Essential hypertension -     amLODipine (NORVASC) 10 MG tablet; Take 1 tablet (10 mg total) by mouth daily. -     lisinopril-hydrochlorothiazide (ZESTORETIC) 20-25 MG tablet; Take 1 tablet by mouth daily. Continue all antihypertensives as prescribed.  Remember to bring in your blood pressure log with you for your follow up appointment.  DASH/Mediterranean Diets are healthier choices for HTN.    Anxiety and depression -     venlafaxine XR (EFFEXOR XR) 37.5 MG 24 hr capsule; Take 1 capsule (37.5 mg total) by mouth daily with breakfast.  Dyspareunia in female -     Ambulatory referral to Gynecology  Gastroesophageal reflux disease without esophagitis -     omeprazole (PRILOSEC) 20 MG capsule; Take 1 capsule (20 mg total) by mouth daily. INSTRUCTIONS: Avoid GERD Triggers: acidic, spicy or fried foods, caffeine, coffee, sodas,  alcohol and chocolate.    Neuropathy -     gabapentin (NEURONTIN) 300 MG capsule; Take 1 capsule (300 mg total) by mouth 3 (three) times daily.  Dyslipidemia, goal LDL below 70 -     atorvastatin (LIPITOR) 20 MG tablet; Take 1 tablet (20 mg total) by mouth daily. INSTRUCTIONS: Work on a low fat, heart healthy diet and participate in regular aerobic exercise program by working out at least 150 minutes per week; 5 days a week-30 minutes per day. Avoid red meat/beef/steak,  fried foods. junk foods, sodas, sugary drinks, unhealthy snacking, alcohol and smoking.  Drink at least 80 oz of water per day and monitor your carbohydrate intake daily.    Breast cancer screening by mammogram -     MM DIGITAL SCREENING BILATERAL; Future  Colon cancer screening -     Ambulatory referral to Gastroenterology  Patient has been counseled on age-appropriate routine health concerns for screening and prevention. These are reviewed and up-to-date. Referrals have been placed  accordingly. Immunizations are up-to-date or declined.    Subjective:   Chief Complaint  Patient presents with   Hypertension   Hypertension Pertinent negatives include no blurred vision, chest pain, headaches, malaise/fatigue, palpitations or shortness of breath.  Anna Walters 45 y.o. female presents to office today for follow up to HTN. She has a past medical history of Back pain, Bell's palsy, and Hypertension, anxiety and depression.   HTN Blood pressure poorly controlled. Will add lisinopril 20 mg to HCTZ 25 mg today. She will continue on amlodipine 10 mg. Denies chest pain, shortness of breath, palpitations, lightheadedness, dizziness, headaches or BLE edema.  She is trying to quit smoking.  BP Readings from Last 3 Encounters:  12/07/20 (!) 162/97  09/27/20 138/82  09/06/20 (!) 149/86    Anxiety and Depression  Taking effexor XR 37.5 mg daily as prescribed. PHQ9 scores have improved as well as GAD7.  Depression screen [facility] 2/9 12/07/2020 09/06/2020 08/30/2019 10/20/2017  Decreased Interest 0 3 1 0  Down, Depressed, Hopeless 1 3 0 1  PHQ - 2 Score '1 6 1 1  '$ Altered sleeping - '3 1 3  '$ Tired, decreased energy - '3 2 3  '$ Change in appetite - '2 1 3  '$ Feeling bad or failure about yourself  - 1 0 1  Trouble concentrating - 1 0 0  Moving slowly or fidgety/restless - 0 1 0  Suicidal thoughts - 0 0 0  PHQ-9 Score - '16 6 11    '$ GAD 7 :  Generalized Anxiety Score 12/07/2020 09/06/2020 08/30/2019 10/20/2017  Nervous, Anxious, on Edge 1 2 0 3  Control/stop worrying '2 2 2 3  '$ Worry too much - different things '2 2 2 3  '$ Trouble relaxing '1 2 2 3  '$ Restless 0 2 1 0  Easily annoyed or irritable '2 2 3 3  '$ Afraid - awful might happen 0 2 0 3  Total GAD 7 Score '8 14 10 18  '$ Anxiety Difficulty Somewhat difficult - - -     Review of Systems  Constitutional:  Negative for fever, malaise/fatigue and weight loss.  HENT: Negative.  Negative for nosebleeds.   Eyes: Negative.  Negative for blurred vision, double  vision and photophobia.  Respiratory: Negative.  Negative for cough and shortness of breath.   Cardiovascular: Negative.  Negative for chest pain, palpitations and leg swelling.  Gastrointestinal:  Positive for heartburn. Negative for abdominal pain, blood in stool, constipation, diarrhea, melena, nausea and vomiting.  Genitourinary:        Moderage vaginal atrophy with dyspareunia   Musculoskeletal: Negative.  Negative for myalgias.  Neurological:  Positive for sensory change. Negative for dizziness, focal weakness, seizures and headaches.  Psychiatric/Behavioral: Negative.  Negative for suicidal ideas.    Past Medical History:  Diagnosis Date   Back pain    Bell's palsy    Hypertension     Past Surgical History:  Procedure Laterality Date   GANGLION CYST EXCISION     HERNIA REPAIR     TOOTH EXTRACTION     UTERINE FIBROID SURGERY      Family History  Problem Relation Age of Onset   Hypertension Mother    Hypertension Father    Cancer Maternal Grandmother    Cancer Paternal Grandmother     Social History Reviewed with no changes to be made today.   Outpatient Medications Prior to Visit  Medication Sig Dispense Refill   Acetaminophen (TYLENOL PO) Take by mouth.     Blood Pressure Monitor DEVI Please provide patient with insurance approved blood pressure monitor. I10.0 1 each 0   Cholecalciferol (VITAMIN D3) 1.25 MG (50000 UT) CAPS Take 1 capsule by mouth once a week. 12 capsule 0   atorvastatin (LIPITOR) 20 MG tablet Take 1 tablet (20 mg total) by mouth daily. 90 tablet 3   gabapentin (NEURONTIN) 300 MG capsule Take 1 capsule (300 mg total) by mouth 3 (three) times daily. 90 capsule 11   hydrochlorothiazide (HYDRODIURIL) 25 MG tablet Take 1 tablet (25 mg total) by mouth daily. 90 tablet 3   venlafaxine XR (EFFEXOR XR) 37.5 MG 24 hr capsule Take 1 capsule (37.5 mg total) by mouth daily with breakfast. 30 capsule 2   amLODipine (NORVASC) 10 MG tablet Take 1 tablet (10 mg  total) by mouth daily. 90 tablet 1   omeprazole (PRILOSEC) 20 MG capsule Take 1 capsule (20 mg total) by mouth daily for 10 days. 10 capsule 0   No facility-administered medications prior to visit.    No Known Allergies     Objective:    BP (!) 162/97 (BP Location: Left Arm, Patient Position: Sitting, Cuff Size: Large)   Pulse 65   Temp 98.5 F (36.9 C) (Oral)   Ht '5\' 4"'$  (1.626 m)   Wt 169 lb 6.4 oz (76.8 kg)   SpO2 95%   BMI 29.08 kg/m  Wt Readings from Last 3 Encounters:  12/07/20 169 lb 6.4 oz (76.8 kg)  09/06/20 171 lb 6.4 oz (77.7 kg)  10/20/19  180 lb 8 oz (81.9 kg)    Physical Exam Vitals and nursing note reviewed.  Constitutional:      Appearance: She is well-developed.  HENT:     Head: Normocephalic and atraumatic.  Cardiovascular:     Rate and Rhythm: Normal rate and regular rhythm.     Heart sounds: Normal heart sounds. No murmur heard.   No friction rub. No gallop.  Pulmonary:     Effort: Pulmonary effort is normal. No tachypnea or respiratory distress.     Breath sounds: Normal breath sounds. No decreased breath sounds, wheezing, rhonchi or rales.  Chest:     Chest wall: No tenderness.  Abdominal:     General: Bowel sounds are normal.     Palpations: Abdomen is soft.  Musculoskeletal:        General: Normal range of motion.     Cervical back: Normal range of motion.  Skin:    General: Skin is warm and dry.  Neurological:     Mental Status: She is alert and oriented to person, place, and time.     Coordination: Coordination normal.  Psychiatric:        Behavior: Behavior normal. Behavior is cooperative.        Thought Content: Thought content normal.        Judgment: Judgment normal.         Patient has been counseled extensively about nutrition and exercise as well as the importance of adherence with medications and regular follow-up. The patient was given clear instructions to go to ER or return to medical center if symptoms don't improve,  worsen or new problems develop. The patient verbalized understanding.   Follow-up: Return for 3 week f/u with [facility] for BP check. See me in October for PAP.Marland Kitchen   Gildardo Pounds, FNP-BC [facility] [place], [place]   12/07/2020, 4:52 PM

## 2020-12-27 DIAGNOSIS — Z419 Encounter for procedure for purposes other than remedying health state, unspecified: Secondary | ICD-10-CM | POA: Diagnosis not present

## 2021-01-01 ENCOUNTER — Ambulatory Visit: Payer: No Typology Code available for payment source | Admitting: Pharmacist

## 2021-01-26 DIAGNOSIS — Z419 Encounter for procedure for purposes other than remedying health state, unspecified: Secondary | ICD-10-CM | POA: Diagnosis not present

## 2021-01-31 DIAGNOSIS — H5203 Hypermetropia, bilateral: Secondary | ICD-10-CM | POA: Diagnosis not present

## 2021-01-31 DIAGNOSIS — G510 Bell's palsy: Secondary | ICD-10-CM | POA: Diagnosis not present

## 2021-01-31 DIAGNOSIS — H04123 Dry eye syndrome of bilateral lacrimal glands: Secondary | ICD-10-CM | POA: Diagnosis not present

## 2021-02-04 ENCOUNTER — Ambulatory Visit: Payer: No Typology Code available for payment source | Admitting: Nurse Practitioner

## 2021-02-18 ENCOUNTER — Ambulatory Visit: Payer: Medicaid Other

## 2021-02-25 ENCOUNTER — Other Ambulatory Visit: Payer: Self-pay

## 2021-02-26 DIAGNOSIS — Z419 Encounter for procedure for purposes other than remedying health state, unspecified: Secondary | ICD-10-CM | POA: Diagnosis not present

## 2021-03-28 DIAGNOSIS — Z419 Encounter for procedure for purposes other than remedying health state, unspecified: Secondary | ICD-10-CM | POA: Diagnosis not present

## 2021-04-05 ENCOUNTER — Encounter: Payer: Self-pay | Admitting: Gastroenterology

## 2021-04-28 DIAGNOSIS — Z419 Encounter for procedure for purposes other than remedying health state, unspecified: Secondary | ICD-10-CM | POA: Diagnosis not present

## 2021-05-29 DIAGNOSIS — Z419 Encounter for procedure for purposes other than remedying health state, unspecified: Secondary | ICD-10-CM | POA: Diagnosis not present

## 2021-06-03 ENCOUNTER — Encounter: Payer: Medicaid Other | Admitting: Obstetrics and Gynecology

## 2021-06-05 ENCOUNTER — Other Ambulatory Visit: Payer: Self-pay

## 2021-06-05 ENCOUNTER — Ambulatory Visit (AMBULATORY_SURGERY_CENTER): Payer: Medicaid Other | Admitting: *Deleted

## 2021-06-05 VITALS — Ht 64.0 in | Wt 175.0 lb

## 2021-06-05 DIAGNOSIS — Z1211 Encounter for screening for malignant neoplasm of colon: Secondary | ICD-10-CM

## 2021-06-05 MED ORDER — NA SULFATE-K SULFATE-MG SULF 17.5-3.13-1.6 GM/177ML PO SOLN: 1.0000 | ORAL | 0 refills | Status: DC

## 2021-06-05 NOTE — Progress Notes (Signed)

## 2021-06-18 ENCOUNTER — Other Ambulatory Visit: Payer: Self-pay | Admitting: Neurology

## 2021-06-18 ENCOUNTER — Other Ambulatory Visit: Payer: Self-pay

## 2021-06-18 ENCOUNTER — Telehealth: Payer: Self-pay | Admitting: Gastroenterology

## 2021-06-18 ENCOUNTER — Other Ambulatory Visit: Payer: Self-pay | Admitting: Nurse Practitioner

## 2021-06-18 ENCOUNTER — Encounter: Payer: Self-pay | Admitting: Certified Registered Nurse Anesthetist

## 2021-06-18 DIAGNOSIS — I10 Essential (primary) hypertension: Secondary | ICD-10-CM

## 2021-06-18 DIAGNOSIS — Z1211 Encounter for screening for malignant neoplasm of colon: Secondary | ICD-10-CM

## 2021-06-18 MED ORDER — NA SULFATE-K SULFATE-MG SULF 17.5-3.13-1.6 GM/177ML PO SOLN
1.0000 | Freq: Once | ORAL | 0 refills | Status: DC
  Filled 2021-06-18: qty 354, 1d supply, fill #0

## 2021-06-18 NOTE — Telephone Encounter (Signed)
Patient called.  Her procedure is tomorrow, but when she went to pick up her prep, she said it is too expensive and she needs something else.  She has been doing the clear liquids all day today, but needs to have a different prep.  She needs to start this soon.  Please call patient and advise.

## 2021-06-18 NOTE — Telephone Encounter (Signed)
Pt asked me to send  [facility] as she can get it cheaper   Sent in script- instructed pt to ask for generic- she will call if it's an issue at the new pharmacy

## 2021-06-19 ENCOUNTER — Ambulatory Visit (AMBULATORY_SURGERY_CENTER): Payer: Medicaid Other | Admitting: Gastroenterology

## 2021-06-19 ENCOUNTER — Encounter: Payer: Self-pay | Admitting: Gastroenterology

## 2021-06-19 VITALS — BP 156/89 | HR 58 | Temp 97.6°F | Resp 13 | Ht 64.0 in | Wt 175.0 lb

## 2021-06-19 DIAGNOSIS — D128 Benign neoplasm of rectum: Secondary | ICD-10-CM

## 2021-06-19 DIAGNOSIS — D122 Benign neoplasm of ascending colon: Secondary | ICD-10-CM | POA: Diagnosis not present

## 2021-06-19 DIAGNOSIS — Z1211 Encounter for screening for malignant neoplasm of colon: Secondary | ICD-10-CM | POA: Diagnosis not present

## 2021-06-19 DIAGNOSIS — K621 Rectal polyp: Secondary | ICD-10-CM | POA: Diagnosis not present

## 2021-06-19 DIAGNOSIS — D129 Benign neoplasm of anus and anal canal: Secondary | ICD-10-CM

## 2021-06-19 DIAGNOSIS — E7800 Pure hypercholesterolemia, unspecified: Secondary | ICD-10-CM | POA: Diagnosis not present

## 2021-06-19 DIAGNOSIS — K635 Polyp of colon: Secondary | ICD-10-CM | POA: Diagnosis not present

## 2021-06-19 MED ORDER — SODIUM CHLORIDE 0.9 % IV SOLN: 500.0000 mL | Freq: Once | INTRAVENOUS | Status: DC

## 2021-06-19 NOTE — Progress Notes (Signed)
Called to room to assist during endoscopic procedure.  Patient ID and intended procedure confirmed with present staff. Received instructions for my participation in the procedure from the performing physician.  

## 2021-06-19 NOTE — Progress Notes (Signed)
Report given to PACU, vss 

## 2021-06-19 NOTE — Progress Notes (Signed)
Vitals-CW  Pt's states no medical or surgical changes since previsit or office visit. 

## 2021-06-19 NOTE — Patient Instructions (Signed)
Follow a high fiber diet. Resume previous diet and medications. Awaiting pathology results. Repeat Colonoscopy date to be determined based on pathology results.  YOU HAD AN ENDOSCOPIC PROCEDURE TODAY AT [facility]:   Refer to the procedure report that was given to you for any specific questions about what was found during the examination.  If the procedure report does not answer your questions, please call your gastroenterologist to clarify.  If you requested that your care partner not be given the details of your procedure findings, then the procedure report has been included in a sealed envelope for you to review at your convenience later.  YOU SHOULD EXPECT: Some feelings of bloating in the abdomen. Passage of more gas than usual.  Walking can help get rid of the air that was put into your GI tract during the procedure and reduce the bloating. If you had a lower endoscopy (such as a colonoscopy or flexible sigmoidoscopy) you may notice spotting of blood in your stool or on the toilet paper. If you underwent a bowel prep for your procedure, you may not have a normal bowel movement for a few days.  Please Note:  You might notice some irritation and congestion in your nose or some drainage.  This is from the oxygen used during your procedure.  There is no need for concern and it should clear up in a day or so.  SYMPTOMS TO REPORT IMMEDIATELY:  Following lower endoscopy (colonoscopy or flexible sigmoidoscopy):  Excessive amounts of blood in the stool  Significant tenderness or worsening of abdominal pains  Swelling of the abdomen that is new, acute  Fever of 100F or higher  For urgent or emergent issues, a gastroenterologist can be reached at any hour by calling [phone]. Do not use MyChart messaging for urgent concerns.    DIET:  We do recommend a small meal at first, but then you may proceed to your regular diet.  Drink plenty of fluids but you should avoid alcoholic  beverages for 24 hours.  ACTIVITY:  You should plan to take it easy for the rest of today and you should NOT DRIVE or use heavy machinery until tomorrow (because of the sedation medicines used during the test).    FOLLOW UP: Our staff will call the number listed on your records 48-72 hours following your procedure to check on you and address any questions or concerns that you may have regarding the information given to you following your procedure. If we do not reach you, we will leave a message.  We will attempt to reach you two times.  During this call, we will ask if you have developed any symptoms of COVID 19. If you develop any symptoms (ie: fever, flu-like symptoms, shortness of breath, cough etc.) before then, please call [phone].  If you test positive for Covid 19 in the 2 weeks post procedure, please call and report this information to [place].    If any biopsies were taken you will be contacted by phone or by letter within the next 1-3 weeks.  Please call us at [phone] if you have not heard about the biopsies in 3 weeks.    SIGNATURES/CONFIDENTIALITY: You and/or your care partner have signed paperwork which will be entered into your electronic medical record.  These signatures attest to the fact that that the information above on your After Visit Summary has been reviewed and is understood.  Full responsibility of the confidentiality of this discharge information lies with  you and/or your care-partner.  °

## 2021-06-19 NOTE — Op Note (Signed)
[facility] Patient Name: Anna Walters Procedure Date: 06/19/2021 10:21 AM MRN: [id number] Endoscopist: Thornton Park MD, MD Age: 46 Referring MD:  Date of Birth: [birth year] Gender: Female Account #: [id number] Procedure:                Colonoscopy Indications:              Screening for colorectal malignant neoplasm, This                            is the patient's first colonoscopy Medicines:                Monitored Anesthesia Care Procedure:                Pre-Anesthesia Assessment:                           - Prior to the procedure, a History and Physical                            was performed, and patient medications and                            allergies were reviewed. The patient's tolerance of                            previous anesthesia was also reviewed. The risks                            and benefits of the procedure and the sedation                            options and risks were discussed with the patient.                            All questions were answered, and informed consent                            was obtained. Prior Anticoagulants: The patient has                            taken no previous anticoagulant or antiplatelet                            agents. ASA Grade Assessment: II - A patient with                            mild systemic disease. After reviewing the risks                            and benefits, the patient was deemed in                            satisfactory condition to undergo the procedure.  After obtaining informed consent, the colonoscope                            was passed under direct vision. Throughout the                            procedure, the patient's blood pressure, pulse, and                            oxygen saturations were monitored continuously. The                            CF [id number] #[id number] was introduced through the anus                            and advanced to the 3  cm into the ileum. A second                            forward view of the right colon was performed. The                            colonoscopy was performed without difficulty. The                            patient tolerated the procedure well. The quality                            of the bowel preparation was excellent. The                            terminal ileum, ileocecal valve, appendiceal                            orifice, and rectum were photographed. Scope In: 10:40:50 AM Scope Out: 10:55:11 AM Scope Withdrawal Time: 0 hours 10 minutes 39 seconds  Total Procedure Duration: 0 hours 14 minutes 21 seconds  Findings:                 The perianal and digital rectal examinations were                            normal.                           Non-bleeding internal hemorrhoids were found.                           Multiple small and large-mouthed diverticula were                            found in the sigmoid colon and distal descending                            colon.  A 5 mm polyp was found in the rectum. The polyp was                            flat. The polyp was removed with a cold snare.                            Resection and retrieval were complete. Estimated                            blood loss was minimal.                           A less than 1 mm polyp was found in the ascending                            colon. The polyp was flat. The polyp was removed                            with a piecemeal technique using a cold biopsy                            forceps. Resection and retrieval were complete.                            Estimated blood loss was minimal.                           The exam was otherwise without abnormality on                            direct and retroflexion views. Complications:            No immediate complications. Estimated blood loss:                            Minimal. Estimated Blood Loss:     Estimated blood  loss was minimal. Impression:               - Non-bleeding internal hemorrhoids.                           - Diverticulosis in the sigmoid colon and in the                            distal descending colon.                           - One 5 mm polyp in the rectum, removed with a cold                            snare. Resected and retrieved.                           - One less than 1 mm polyp in the ascending colon,  removed piecemeal using a cold biopsy forceps.                            Resected and retrieved.                           - The examination was otherwise normal on direct                            and retroflexion views. Recommendation:           - Patient has a contact number available for                            emergencies. The signs and symptoms of potential                            delayed complications were discussed with the                            patient. Return to normal activities tomorrow.                            Written discharge instructions were provided to the                            patient.                           - Continue present medications.                           - Await pathology results.                           - Repeat colonoscopy date to be determined after                            pending pathology results are reviewed for                            surveillance.                           - Follow a high fiber diet. Drink at least 64                            ounces of water daily. Add a daily stool bulking                            agent such as psyllium (an exampled would be                            Metamucil).                           - Emerging  evidence supports eating a diet of                            fruits, vegetables, grains, calcium, and yogurt                            while reducing red meat and alcohol may reduce the                            risk of colon cancer.                            - Thank you for allowing me to be involved in your                            colon cancer prevention. Thornton Park MD, MD 06/19/2021 10:59:42 AM This report has been signed electronically.

## 2021-06-19 NOTE — Progress Notes (Signed)
Referring Provider: Gildardo Pounds, NP Primary Care Physician:  Gildardo Pounds, NP  Reason for Procedure:  Colon cancer screening   IMPRESSION:  Need for colon cancer screening Appropriate candidate for monitored anesthesia care  PLAN: Colonoscopy in the [place] today   HPI: Anna Walters is a 46 y.o. female presents for screening colonoscopy.  No prior colonoscopy or colon cancer screening.  No baseline GI symptoms.   No known family history of colon cancer or polyps. No family history of uterine/endometrial cancer, pancreatic cancer or gastric/stomach cancer.   Past Medical History:  Diagnosis Date   Back pain    Bell's palsy    GERD (gastroesophageal reflux disease)    Hyperlipidemia    Hypertension     Past Surgical History:  Procedure Laterality Date   COLONOSCOPY     10 years ago in [place], [place] -normal exam per patient   GANGLION CYST EXCISION     HERNIA REPAIR     TOOTH EXTRACTION     UTERINE FIBROID SURGERY      Current Outpatient Medications  Medication Sig Dispense Refill   amLODipine (NORVASC) 10 MG tablet Take 1 tablet (10 mg total) by mouth daily. 90 tablet 1   atorvastatin (LIPITOR) 20 MG tablet Take 1 tablet (20 mg total) by mouth daily. 90 tablet 3   Blood Pressure Monitor DEVI Please provide patient with insurance approved blood pressure monitor. I10.0 1 each 0   Cholecalciferol (VITAMIN D3) 1.25 MG (50000 UT) CAPS Take 1 capsule by mouth once a week. 12 capsule 0   gabapentin (NEURONTIN) 300 MG capsule Take 1 capsule (300 mg total) by mouth 3 (three) times daily. 90 capsule 11   lisinopril-hydrochlorothiazide (ZESTORETIC) 20-25 MG tablet Take 1 tablet by mouth daily. 90 tablet 3   Na Sulfate-K Sulfate-Mg Sulf (SUPREP BOWEL PREP KIT) 17.5-3.13-1.6 GM/177ML SOLN Take 1 kit by mouth once for 1 dose. suprep generic- no PA's 354 mL 0   Na Sulfate-K Sulfate-Mg Sulf 17.5-3.13-1.6 GM/177ML SOLN Take 1 kit by mouth as directed. May use generic Suprep  354 mL 0   omeprazole (PRILOSEC) 20 MG capsule Take 1 capsule (20 mg total) by mouth daily. 90 capsule 1   venlafaxine XR (EFFEXOR XR) 37.5 MG 24 hr capsule Take 1 capsule (37.5 mg total) by mouth daily with breakfast. 90 capsule 2   No current facility-administered medications for this visit.    Allergies as of 06/19/2021   (No Known Allergies)    Family History  Problem Relation Age of Onset   Hypertension Mother    Hypertension Father    Stomach cancer Maternal Aunt    Cancer Maternal Grandmother    Cancer Paternal Grandmother    Colon cancer Neg Hx    Colon polyps Neg Hx    Esophageal cancer Neg Hx    Rectal cancer Neg Hx      Physical Exam: General:   Alert,  well-nourished, pleasant and cooperative in NAD Head:  Normocephalic and atraumatic. Eyes:  Sclera clear, no icterus.   Conjunctiva pink. Mouth:  No deformity or lesions.   Neck:  Supple; no masses or thyromegaly. Lungs:  Clear throughout to auscultation.   No wheezes. Heart:  Regular rate and rhythm; no murmurs. Abdomen:  Soft, non-tender, nondistended, normal bowel sounds, no rebound or guarding.  Msk:  Symmetrical. No boney deformities LAD: No inguinal or umbilical LAD Extremities:  No clubbing or edema. Neurologic:  Alert and  oriented x4;  grossly nonfocal  Skin:  No obvious rash or bruise. Psych:  Alert and cooperative. Normal mood and affect.     Studies/Results: No results found.    Usher Hedberg L. Tarri Glenn, MD, MPH 06/19/2021, 10:07 AM

## 2021-06-20 MED ORDER — BLOOD PRESSURE MONITOR DEVI: 0 refills | Status: AC

## 2021-06-21 ENCOUNTER — Telehealth: Payer: Self-pay

## 2021-06-21 ENCOUNTER — Other Ambulatory Visit: Payer: Self-pay

## 2021-06-21 ENCOUNTER — Telehealth: Payer: Self-pay | Admitting: *Deleted

## 2021-06-21 NOTE — Telephone Encounter (Signed)
Faxed sent for blood pressure monitor.

## 2021-06-21 NOTE — Telephone Encounter (Signed)
°  Follow up Call-  Call back number 06/19/2021  Post procedure Call Back phone  # [phone]  Permission to leave phone message Yes  Some recent data might be hidden     Patient questions:  Do you have a fever, pain , or abdominal swelling? No. Pain Score  0 *  Have you tolerated food without any problems? Yes.    Have you been able to return to your normal activities? Yes.    Do you have any questions about your discharge instructions: Diet   No. Medications  No. Follow up visit  No.  Do you have questions or concerns about your Care? No.  Actions: * If pain score is 4 or above: No action needed, pain <4.

## 2021-06-22 DIAGNOSIS — I10 Essential (primary) hypertension: Secondary | ICD-10-CM | POA: Diagnosis not present

## 2021-06-24 ENCOUNTER — Encounter: Payer: Medicaid Other | Admitting: Family Medicine

## 2021-06-24 ENCOUNTER — Encounter: Payer: Self-pay | Admitting: Family Medicine

## 2021-06-24 NOTE — Progress Notes (Signed)
Patient did not keep appointment today. She may call to reschedule.  

## 2021-06-25 ENCOUNTER — Other Ambulatory Visit: Payer: Self-pay

## 2021-06-26 DIAGNOSIS — Z419 Encounter for procedure for purposes other than remedying health state, unspecified: Secondary | ICD-10-CM | POA: Diagnosis not present

## 2021-07-04 ENCOUNTER — Encounter: Payer: Self-pay | Admitting: Gastroenterology

## 2021-07-09 ENCOUNTER — Other Ambulatory Visit: Payer: Self-pay

## 2021-07-16 ENCOUNTER — Other Ambulatory Visit: Payer: Self-pay

## 2021-07-27 DIAGNOSIS — Z419 Encounter for procedure for purposes other than remedying health state, unspecified: Secondary | ICD-10-CM | POA: Diagnosis not present

## 2021-08-26 DIAGNOSIS — Z419 Encounter for procedure for purposes other than remedying health state, unspecified: Secondary | ICD-10-CM | POA: Diagnosis not present

## 2021-09-26 DIAGNOSIS — Z419 Encounter for procedure for purposes other than remedying health state, unspecified: Secondary | ICD-10-CM | POA: Diagnosis not present

## 2021-10-11 DIAGNOSIS — H5213 Myopia, bilateral: Secondary | ICD-10-CM | POA: Diagnosis not present

## 2021-10-26 DIAGNOSIS — Z419 Encounter for procedure for purposes other than remedying health state, unspecified: Secondary | ICD-10-CM | POA: Diagnosis not present

## 2021-11-05 DIAGNOSIS — H524 Presbyopia: Secondary | ICD-10-CM | POA: Diagnosis not present

## 2023-10-14 ENCOUNTER — Ambulatory Visit (INDEPENDENT_AMBULATORY_CARE_PROVIDER_SITE_OTHER): Admitting: Primary Care

## 2024-02-24 DIAGNOSIS — E559 Vitamin D deficiency, unspecified: Secondary | ICD-10-CM | POA: Diagnosis not present

## 2024-02-24 DIAGNOSIS — E785 Hyperlipidemia, unspecified: Secondary | ICD-10-CM | POA: Diagnosis not present

## 2024-02-24 DIAGNOSIS — K219 Gastro-esophageal reflux disease without esophagitis: Secondary | ICD-10-CM | POA: Diagnosis not present

## 2024-02-24 DIAGNOSIS — D7589 Other specified diseases of blood and blood-forming organs: Secondary | ICD-10-CM | POA: Diagnosis not present

## 2024-02-24 DIAGNOSIS — I10 Essential (primary) hypertension: Secondary | ICD-10-CM | POA: Diagnosis not present
